# Patient Record
Sex: Male | Born: 1985 | Race: White | Hispanic: No | Marital: Single | Smoking: Never smoker
Health system: Southern US, Community
[De-identification: ages and names within clinical notes are randomized; demographics above are authoritative.]

## PROBLEM LIST (undated history)

## (undated) HISTORY — PX: CLEFT PALATE REPAIR: SUR1165

---
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
## 2014-11-13 ENCOUNTER — Encounter: Payer: Self-pay | Admitting: Emergency Medicine

## 2014-11-13 ENCOUNTER — Emergency Department: Payer: Self-pay

## 2014-11-13 ENCOUNTER — Emergency Department
Admission: EM | Admit: 2014-11-13 | Discharge: 2014-11-13 | Disposition: A | Discharge status: Home / Self Care | Payer: Self-pay | Attending: Emergency Medicine | Admitting: Emergency Medicine

## 2014-11-13 DIAGNOSIS — F419 Anxiety disorder, unspecified: Secondary | ICD-10-CM | POA: Insufficient documentation

## 2014-11-13 DIAGNOSIS — R002 Palpitations: Secondary | ICD-10-CM | POA: Insufficient documentation

## 2014-11-13 DIAGNOSIS — R0789 Other chest pain: Secondary | ICD-10-CM | POA: Insufficient documentation

## 2014-11-13 DIAGNOSIS — R079 Chest pain, unspecified: Secondary | ICD-10-CM

## 2014-11-13 DIAGNOSIS — R200 Anesthesia of skin: Secondary | ICD-10-CM | POA: Insufficient documentation

## 2014-11-13 LAB — COMPREHENSIVE METABOLIC PANEL
ALT: 15 U/L — ABNORMAL LOW (ref 17–63)
ANION GAP: 7 (ref 5–15)
AST: 24 U/L (ref 15–41)
Albumin: 5.2 g/dL — ABNORMAL HIGH (ref 3.5–5.0)
Alkaline Phosphatase: 88 U/L (ref 38–126)
BILIRUBIN TOTAL: 0.6 mg/dL (ref 0.3–1.2)
BUN: 11 mg/dL (ref 6–20)
CO2: 30 mmol/L (ref 22–32)
Calcium: 10 mg/dL (ref 8.9–10.3)
Chloride: 105 mmol/L (ref 101–111)
Creatinine, Ser: 1 mg/dL (ref 0.61–1.24)
GFR calc Af Amer: >60 mL/min (ref >60)
Glucose, Bld: 96 mg/dL (ref 65–99)
POTASSIUM: 3.8 mmol/L (ref 3.5–5.1)
Sodium: 142 mmol/L (ref 135–145)
TOTAL PROTEIN: 8.8 g/dL — AB (ref 6.5–8.1)

## 2014-11-13 LAB — URINE DRUG SCREEN, QUALITATIVE (ARMC ONLY)
Amphetamines, Ur Screen: NOT DETECTED
BARBITURATES, UR SCREEN: NOT DETECTED
Benzodiazepine, Ur Scrn: NOT DETECTED
COCAINE METABOLITE, UR ~~LOC~~: NOT DETECTED
Cannabinoid 50 Ng, Ur ~~LOC~~: NOT DETECTED
MDMA (ECSTASY) UR SCREEN: NOT DETECTED
METHADONE SCREEN, URINE: NOT DETECTED
OPIATE, UR SCREEN: NOT DETECTED
Phencyclidine (PCP) Ur S: NOT DETECTED
Tricyclic, Ur Screen: NOT DETECTED

## 2014-11-13 LAB — URINALYSIS COMPLETE WITH MICROSCOPIC (ARMC ONLY)
Bacteria, UA: NONE SEEN
Bilirubin Urine: NEGATIVE
GLUCOSE, UA: NEGATIVE mg/dL
HGB URINE DIPSTICK: NEGATIVE
Ketones, ur: NEGATIVE mg/dL
LEUKOCYTES UA: NEGATIVE
NITRITE: NEGATIVE
Protein, ur: NEGATIVE mg/dL
SPECIFIC GRAVITY, URINE: 1.001 — AB (ref 1.005–1.030)
SQUAMOUS EPITHELIAL / LPF: NONE SEEN
pH: 6 (ref 5.0–8.0)

## 2014-11-13 LAB — CBC
HEMATOCRIT: 42.3 % (ref 40.0–52.0)
Hemoglobin: 14.2 g/dL (ref 13.0–18.0)
MCH: 28.3 pg (ref 26.0–34.0)
MCHC: 33.7 g/dL (ref 32.0–36.0)
MCV: 84.1 fL (ref 80.0–100.0)
Platelets: 240 10*3/uL (ref 150–440)
RBC: 5.03 MIL/uL (ref 4.40–5.90)
RDW: 16 % — AB (ref 11.5–14.5)
WBC: 6.6 10*3/uL (ref 3.8–10.6)

## 2014-11-13 LAB — TROPONIN I

## 2014-11-13 MED ORDER — IBUPROFEN 600 MG PO TABS: 600.0000 mg | ORAL_TABLET | Freq: Three times a day (TID) | ORAL | Status: DC | PRN

## 2014-11-13 MED ORDER — KETOROLAC TROMETHAMINE 30 MG/ML IJ SOLN
30.0000 mg | Freq: Once | INTRAMUSCULAR | Status: AC
  Administered 2014-11-13: 30 mg via INTRAVENOUS
  Filled 2014-11-13: qty 1

## 2014-11-13 MED ORDER — PANTOPRAZOLE SODIUM 40 MG IV SOLR
40.0000 mg | Freq: Once | INTRAVENOUS | Status: AC
  Administered 2014-11-13: 40 mg via INTRAVENOUS
  Filled 2014-11-13: qty 40

## 2014-11-13 NOTE — ED Provider Notes (Signed)
[facility] Emergency Department Provider Note  ____________________________________________  Time seen: Approximately 5:03 AM  I have reviewed the triage vital signs and the nursing notes.   HISTORY  Chief Complaint Chest Pain and Numbness    HPI FRANKI STEMEN is a 29 y.o. male who presents to the ED from home with the chief complaint of chest pain. Patient reports a two-week history of intermittent left-sided sharp chest pain radiating to his left armpit and arm. Has not sought medical evaluation. Presents tonight due to his girlfriend and mother bring him to the ED for patient awakening with chest pain associated with palpitations. Patient thinks he woke up secondary to a bad dream. Appears anxious. Denies recent fever, chills, cough, shortness of breath, nausea, vomiting, diarrhea. Denies tearing or ripping type of chest pain. States he is ambidextrous but mostly uses his left arm for most things. Works at [employer]. Has been taking Pepto-Bismol more recently secondary to "gas". Denies recent travel or trauma   History reviewed. No pertinent past medical history.  There are no active problems to display for this patient.   Past Surgical History  Procedure Laterality Date  . Cleft palate repair      Current Outpatient Rx  Name  Route  Sig  Dispense  Refill  . bismuth subsalicylate (PEPTO BISMOL) 262 MG/15ML suspension   Oral   Take 30 mLs by mouth every 4 (four) hours as needed.         . ranitidine (ZANTAC) 150 MG tablet   Oral   Take 150 mg by mouth 2 (two) times daily as needed for heartburn.           Allergies Bee venom and Codeine  Family history "Heart problems"   Social History Social History  Substance Use Topics  . Smoking status: Never Smoker   . Smokeless tobacco: None  . Alcohol Use: No    Review of Systems Constitutional: No fever/chills Eyes: No visual changes. ENT: No sore throat. Cardiovascular: Positive  for chest pain. Respiratory: Denies shortness of breath. Gastrointestinal: No abdominal pain.  No nausea, no vomiting.  No diarrhea.  No constipation. Genitourinary: Negative for dysuria. Musculoskeletal: Negative for back pain. Skin: Negative for rash. Neurological: Negative for headaches, focal weakness or numbness.  10-point ROS otherwise negative.  ____________________________________________   PHYSICAL EXAM:  VITAL SIGNS: ED Triage Vitals  Enc Vitals Group     BP 11/13/14 0437 124/90 mmHg     Pulse Rate 11/13/14 0437 97     Resp 11/13/14 0437 18     Temp 11/13/14 0437 98 F (36.7 C)     Temp Source 11/13/14 0437 Oral     SpO2 11/13/14 0437 98 %     Weight 11/13/14 0437 145 lb (65.772 kg)     Height 11/13/14 0437  (1.854 m)     Head Cir --      Peak Flow --      Pain Score 11/13/14 0433 5     Pain Loc --      Pain Edu? --      Excl. in GC? --     Constitutional: Alert and oriented. Well appearing and in no acute distress. Anxious. Eyes: Conjunctivae are normal. PERRL. EOMI. Head: Atraumatic. Nose: No congestion/rhinnorhea. Mouth/Throat: Mucous membranes are moist.  Oropharynx non-erythematous. Neck: No stridor.   Cardiovascular: Normal rate, regular rhythm. Grossly normal heart sounds.  Good peripheral circulation. Respiratory: Normal respiratory effort.  No retractions. Lungs  CTAB. Anterior chest tender to palpation and also with movement of LUE. Gastrointestinal: Soft and nontender. No distention. No abdominal bruits. No CVA tenderness. Musculoskeletal: No lower extremity tenderness nor edema.  No joint effusions. Neurologic:  Normal speech and language. No gross focal neurologic deficits are appreciated. No gait instability. Skin:  Skin is warm, dry and intact. No rash noted. Psychiatric: Mood and affect are normal. Speech and behavior are normal.  ____________________________________________   LABS (all labs ordered are listed, but only abnormal  results are displayed)  Labs Reviewed  CBC - Abnormal; Notable for the following:    RDW 16.0 (*)    All other components within normal limits  COMPREHENSIVE METABOLIC PANEL - Abnormal; Notable for the following:    Total Protein 8.8 (*)    Albumin 5.2 (*)    ALT 15 (*)    All other components within normal limits  URINALYSIS COMPLETEWITH MICROSCOPIC ([facility] ONLY) - Abnormal; Notable for the following:    Color, Urine COLORLESS (*)    APPearance CLEAR (*)    Specific Gravity, Urine 1.001 (*)    All other components within normal limits  TROPONIN I  URINE DRUG SCREEN, QUALITATIVE ([facility] ONLY)   ____________________________________________  EKG  ED ECG REPORT I, Rickell Wiehe J, the attending physician, personally viewed and interpreted this ECG.   Date: 11/13/2014  EKG Time: 0434  Rate: 72  Rhythm: normal EKG, normal sinus rhythm  Axis: Normal  Intervals:none  ST&T Change: Nonspecific  ____________________________________________  RADIOLOGY  Chest 2 view (view by me, interpreted per Dr. Karie Kirks): Hyperinflation without acute cardiopulmonary process. ____________________________________________   PROCEDURES  Procedure(s) performed: None  Critical Care performed: No  ____________________________________________   INITIAL IMPRESSION / ASSESSMENT AND PLAN / ED COURSE  Pertinent labs & imaging results that were available during my care of the patient were reviewed by me and considered in my medical decision making (see chart for details).  29 year old male with a two-week history of intermittent chest pain which is reproducible on exam. Will check screening labs including troponin, obtain chest x-ray; administer IV NSAIDs and reassess.  ----------------------------------------- 6:27 AM on 11/13/2014 -----------------------------------------  Patient improved. Updated patient and family of laboratory results. Strict return precautions given. All verbalize  understanding and agree with plan of care. ____________________________________________   FINAL CLINICAL IMPRESSION(S) / ED DIAGNOSES  Final diagnoses:  Chest pain, unspecified chest pain type  Chest wall pain      Irean Hong, MD 11/13/14 [phone]

## 2014-11-13 NOTE — ED Notes (Signed)
Pt c/o intermittent left sided chest pain, sharp, for about 2 weeks; numbness down left arm to fingertips; "soreness" under left arm; left arm weakness; tonight the pain woke him from sleep; felt like his heart was racing;  last vomited yesterday;

## 2014-11-13 NOTE — ED Notes (Signed)
Pt presents to ED with c/o chest tightness x2 weeks, dyspnea (pt reports pain when taking deep breaths), and left sided weakness and numbness. Pt reports most of the weakness/numbness is in the LUE, but reports similar s/x's occasionally in the LLE. Pt is A&O, in NAD,with respirations even, regular, and unlabored. Family at bedside.

## 2014-11-13 NOTE — Discharge Instructions (Signed)
1. Take pain medicine as needed (Motrin #15). 2. Apply moist heat to affected area several times daily. 3. Return to the ER for worsening symptoms, persistent vomiting, difficulty breathing or other concerns.  Chest Wall Pain Chest wall pain is pain in or around the bones and muscles of your chest. Sometimes, an injury causes this pain. Sometimes, the cause may not be known. This pain may take several weeks or longer to get better. HOME CARE INSTRUCTIONS  Pay attention to any changes in your symptoms. Take these actions to help with your pain:   Rest as told by your health care provider.   Avoid activities that cause pain. These include any activities that use your chest muscles or your abdominal and side muscles to lift heavy items.   If directed, apply ice to the painful area:  Put ice in a plastic bag.  Place a towel between your skin and the bag.  Leave the ice on for 20 minutes, 2-3 times per day.  Take over-the-counter and prescription medicines only as told by your health care provider.  Do not use tobacco products, including cigarettes, chewing tobacco, and e-cigarettes. If you need help quitting, ask your health care provider.  Keep all follow-up visits as told by your health care provider. This is important. SEEK MEDICAL CARE IF:  You have a fever.  Your chest pain becomes worse.  You have new symptoms. SEEK IMMEDIATE MEDICAL CARE IF:  You have nausea or vomiting.  You feel sweaty or light-headed.  You have a cough with phlegm (sputum) or you cough up blood.  You develop shortness of breath.   This information is not intended to replace advice given to you by your health care provider. Make sure you discuss any questions you have with your health care provider.   Document Released: 01/21/2005 Document Revised: 10/12/2014 Document Reviewed: 04/18/2014 Elsevier Interactive Patient Education [employer]

## 2015-10-06 ENCOUNTER — Emergency Department
Admission: EM | Admit: 2015-10-06 | Discharge: 2015-10-06 | Disposition: A | Discharge status: Home / Self Care | Payer: Self-pay | Attending: Emergency Medicine | Admitting: Emergency Medicine

## 2015-10-06 DIAGNOSIS — M549 Dorsalgia, unspecified: Secondary | ICD-10-CM

## 2015-10-06 DIAGNOSIS — M542 Cervicalgia: Secondary | ICD-10-CM | POA: Insufficient documentation

## 2015-10-06 DIAGNOSIS — M5416 Radiculopathy, lumbar region: Secondary | ICD-10-CM | POA: Insufficient documentation

## 2015-10-06 DIAGNOSIS — H9211 Otorrhea, right ear: Secondary | ICD-10-CM | POA: Insufficient documentation

## 2015-10-06 LAB — URINALYSIS COMPLETE WITH MICROSCOPIC (ARMC ONLY)
BACTERIA UA: NONE SEEN
Bilirubin Urine: NEGATIVE
GLUCOSE, UA: NEGATIVE mg/dL
Hgb urine dipstick: NEGATIVE
Ketones, ur: NEGATIVE mg/dL
Leukocytes, UA: NEGATIVE
NITRITE: NEGATIVE
Protein, ur: NEGATIVE mg/dL
SPECIFIC GRAVITY, URINE: 1.023 (ref 1.005–1.030)
Squamous Epithelial / LPF: NONE SEEN
pH: 5 (ref 5.0–8.0)

## 2015-10-06 MED ORDER — TRAMADOL HCL 50 MG PO TABS: 50.0000 mg | ORAL_TABLET | Freq: Four times a day (QID) | ORAL | 0 refills | Status: AC | PRN

## 2015-10-06 MED ORDER — KETOROLAC TROMETHAMINE 30 MG/ML IJ SOLN
60.0000 mg | INTRAMUSCULAR | Status: DC
  Filled 2015-10-06: qty 2

## 2015-10-06 MED ORDER — OXYCODONE-ACETAMINOPHEN 5-325 MG PO TABS
2.0000 | ORAL_TABLET | Freq: Once | ORAL | Status: AC
  Administered 2015-10-06: 2 via ORAL
  Filled 2015-10-06: qty 2

## 2015-10-06 MED ORDER — IBUPROFEN 800 MG PO TABS: 800.0000 mg | ORAL_TABLET | Freq: Three times a day (TID) | ORAL | 0 refills | Status: DC | PRN

## 2015-10-06 MED ORDER — KETOROLAC TROMETHAMINE 60 MG/2ML IM SOLN
60.0000 mg | Freq: Once | INTRAMUSCULAR | Status: AC
  Administered 2015-10-06: 60 mg via INTRAMUSCULAR

## 2015-10-06 NOTE — ED Notes (Signed)
MD Taylor at bedside.

## 2015-10-06 NOTE — ED Notes (Signed)
Pt c/o lower back pain X 1 month. Pain radiates down patient's right leg.  Pt c/o headache beginning this morning that woke him from sleep. 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

## 2015-10-06 NOTE — ED Provider Notes (Addendum)
[facility] Emergency Department Provider Note  ___________________________________________   None    (approximate)  I have reviewed the triage vital signs and the nursing notes.   HISTORY  Chief Complaint Back Pain    HPI Kenneth Fowler is a 30 y.o. male who is complaining that he woke up with worsening back pain to his lower back that radiates into his right buttock and right leg. Patient also states that he has some pain on the left side of his neck. Patient denies any recent illnesses, dysuria, frequency, or hematuria. He denies any history of kidney stones. Patient states that the back pain is been going on for about 4-6 weeks but since it hurt more this morning he decided to get checked out.  No past medical history on file.  There are no active problems to display for this patient.   Past Surgical History:  Procedure Laterality Date  . CLEFT PALATE REPAIR      Prior to Admission medications   Medication Sig Start Date End Date Taking? Authorizing Provider  bismuth subsalicylate (PEPTO BISMOL) 262 MG/15ML suspension Take 30 mLs by mouth every 4 (four) hours as needed.    Historical Provider, MD  ibuprofen (ADVIL,MOTRIN) 600 MG tablet Take 1 tablet (600 mg total) by mouth every 8 (eight) hours as needed. 11/13/14   Irean Hong, MD  ranitidine (ZANTAC) 150 MG tablet Take 150 mg by mouth 2 (two) times daily as needed for heartburn.    Historical Provider, MD    Allergies Bee venom and Codeine  No family history on file.  Social History Social History  Substance Use Topics  . Smoking status: Never Smoker  . Smokeless tobacco: Not on file  . Alcohol use No    Review of Systems Constitutional: No fever/chills Eyes: No visual changes. ENT: No sore throat. Cardiovascular: Denies chest pain. Respiratory: Denies shortness of breath. Gastrointestinal: No abdominal pain.  No nausea, no vomiting.  No diarrhea.  No constipation. Genitourinary:  Negative for dysuria.No hematuria or frequency Musculoskeletal: Positive for back pain lower than the radiates into his right buttock and right leg. Positive for neck pain on the left side of his neck. Skin: Negative for rash. Neurological: Negative for headaches, focal weakness or numbness.  10-point ROS otherwise negative.  ____________________________________________   PHYSICAL EXAM:  VITAL SIGNS: ED Triage Vitals  Enc Vitals Group     BP 10/06/15 0531 130/75     Pulse Rate 10/06/15 0531 78     Resp 10/06/15 0531 16     Temp 10/06/15 0531 98.1 F (36.7 C)     Temp Source 10/06/15 0531 Oral     SpO2 10/06/15 0531 100 %     Weight 10/06/15 0530 140 lb (63.5 kg)     Height 10/06/15 0530 6' (1.829 m)     Head Circumference --      Peak Flow --      Pain Score 10/06/15 0530 8     Pain Loc --      Pain Edu? --      Excl. in GC? --     Constitutional: Alert and oriented. Well appearing and in no acute distress. Eyes: Conjunctivae are normal. PERRL. EOMI. Head: Atraumatic.History cleft palate repair. Right TM has a small amount of whitish drainage. Nose: No congestion/rhinnorhea. Mouth/Throat: Mucous membranes are moist.  Oropharynx non-erythematous. Neck: No stridor.   Cardiovascular: Normal rate, regular rhythm. Grossly normal heart sounds.  Good peripheral circulation. Respiratory:  Normal respiratory effort.  No retractions. Lungs CTAB. Gastrointestinal: Soft and nontender. No distention. No abdominal bruits. No CVA tenderness. Musculoskeletal: No lower extremity tenderness nor edema.  No joint effusions.Patient with mild tenderness to his right paralumbar muscle area into his right mid buttock with a mild straight leg raise on the right. Patient also has some mild tenderness along the left side of his neck. Patient does not appear to be significantly uncomfortable. Patient has no CVA tenderness. Neurologic:  Normal speech and language. No gross focal neurologic deficits are  appreciated. No gait instability. Skin:  Skin is warm, dry and intact. No rash noted. Psychiatric: Mood and affect are normal. Speech and behavior are normal.  ____________________________________________   LABS (all labs ordered are listed, but only abnormal results are displayed)  Labs Reviewed  URINALYSIS COMPLETEWITH MICROSCOPIC ([facility] ONLY) - Abnormal; Notable for the following:       Result Value   Color, Urine YELLOW (*)    APPearance CLEAR (*)    All other components within normal limits   ____________________________________________  EKG  none ____________________________________________  RADIOLOGY none ____________________________________________   PROCEDURES  Procedure(s) performed: None  Procedures  Critical Care performed: No  ____________________________________________   INITIAL IMPRESSION / ASSESSMENT AND PLAN / ED COURSE  Pertinent labs & imaging results that were available during my care of the patient were reviewed by me and considered in my medical decision making (see chart for details).  7:13 AM He was given a shot of Toradol while awaiting his urinalysis. The urinalysis did not show anything acute. Patient was told that we will follow him up with orthopedics to be evaluated further. Patient was told to return immediately if condition worsens. Patient was placed on tramadol and ibuprofen to take at home.  Clinical Course     ____________________________________________   FINAL CLINICAL IMPRESSION(S) / ED DIAGNOSES  Final diagnoses:  Acute back pain  Lumbar radiculopathy      NEW MEDICATIONS STARTED DURING THIS VISIT:  New Prescriptions   No medications on file     Note:  This document was prepared using Dragon voice recognition software and may include unintentional dictation errors.    Leona CarryLinda M Taler Kushner, MD 10/06/15 [id number]    Leona CarryLinda M Nataliyah Packham, MD 10/06/15 [phone]

## 2015-10-06 NOTE — ED Triage Notes (Signed)
Patient ambulatory to triage with steady gait, without difficulty or distress noted; pt reports lower back pain radiating down right leg x month; st was awoken PTA with pain; denies any known injury

## 2017-09-30 ENCOUNTER — Other Ambulatory Visit
Admission: RE | Admit: 2017-09-30 | Discharge: 2017-09-30 | Disposition: A | Discharge status: Home / Self Care | Payer: 59 | Source: Ambulatory Visit | Attending: Family | Admitting: Family

## 2017-09-30 DIAGNOSIS — W6191XA Bitten by other birds, initial encounter: Secondary | ICD-10-CM | POA: Insufficient documentation

## 2017-09-30 DIAGNOSIS — R6883 Chills (without fever): Secondary | ICD-10-CM | POA: Insufficient documentation

## 2017-09-30 LAB — CBC WITH DIFFERENTIAL/PLATELET
BASOS ABS: 0 10*3/uL (ref 0–0.1)
Basophils Relative: 0 %
Eosinophils Absolute: 0.2 10*3/uL (ref 0–0.7)
Eosinophils Relative: 3 %
HEMATOCRIT: 41.2 % (ref 40.0–52.0)
Hemoglobin: 14.1 g/dL (ref 13.0–18.0)
LYMPHS ABS: 1.3 10*3/uL (ref 1.0–3.6)
LYMPHS PCT: 22 %
MCH: 29.3 pg (ref 26.0–34.0)
MCHC: 34.2 g/dL (ref 32.0–36.0)
MCV: 85.5 fL (ref 80.0–100.0)
MONO ABS: 0.8 10*3/uL (ref 0.2–1.0)
Monocytes Relative: 13 %
NEUTROS ABS: 3.7 10*3/uL (ref 1.4–6.5)
Neutrophils Relative %: 62 %
Platelets: 232 10*3/uL (ref 150–440)
RBC: 4.82 MIL/uL (ref 4.40–5.90)
RDW: 16.4 % — AB (ref 11.5–14.5)
WBC: 6 10*3/uL (ref 3.8–10.6)

## 2017-10-01 LAB — MISC LABCORP TEST (SEND OUT)
Labcorp test code: 138006
Labcorp test code: 138022

## 2020-04-22 ENCOUNTER — Emergency Department: Payer: Managed Care, Other (non HMO)

## 2020-04-22 ENCOUNTER — Other Ambulatory Visit: Payer: Self-pay

## 2020-04-22 ENCOUNTER — Emergency Department
Admission: EM | Admit: 2020-04-22 | Discharge: 2020-04-22 | Disposition: A | Discharge status: Home / Self Care | Payer: Managed Care, Other (non HMO) | Attending: Emergency Medicine | Admitting: Emergency Medicine

## 2020-04-22 DIAGNOSIS — R1031 Right lower quadrant pain: Secondary | ICD-10-CM | POA: Diagnosis present

## 2020-04-22 DIAGNOSIS — N201 Calculus of ureter: Secondary | ICD-10-CM | POA: Insufficient documentation

## 2020-04-22 DIAGNOSIS — R3129 Other microscopic hematuria: Secondary | ICD-10-CM

## 2020-04-22 DIAGNOSIS — N3001 Acute cystitis with hematuria: Secondary | ICD-10-CM | POA: Insufficient documentation

## 2020-04-22 LAB — URINALYSIS, COMPLETE (UACMP) WITH MICROSCOPIC
RBC / HPF: >50 RBC/hpf — ABNORMAL HIGH (ref 0–5)
Specific Gravity, Urine: 1.024 (ref 1.005–1.030)
Squamous Epithelial / LPF: NONE SEEN (ref 0–5)

## 2020-04-22 LAB — BASIC METABOLIC PANEL
Anion gap: 7 (ref 5–15)
BUN: 16 mg/dL (ref 6–20)
CO2: 25 mmol/L (ref 22–32)
Calcium: 9.6 mg/dL (ref 8.9–10.3)
Chloride: 106 mmol/L (ref 98–111)
Creatinine, Ser: 0.81 mg/dL (ref 0.61–1.24)
GFR, Estimated: >60 mL/min (ref >60)
Glucose, Bld: 111 mg/dL — ABNORMAL HIGH (ref 70–99)
Potassium: 4.2 mmol/L (ref 3.5–5.1)
Sodium: 138 mmol/L (ref 135–145)

## 2020-04-22 LAB — CBC
HCT: 40.1 % (ref 39.0–52.0)
Hemoglobin: 13.6 g/dL (ref 13.0–17.0)
MCH: 29.2 pg (ref 26.0–34.0)
MCHC: 33.9 g/dL (ref 30.0–36.0)
MCV: 86.1 fL (ref 80.0–100.0)
Platelets: 221 10*3/uL (ref 150–400)
RBC: 4.66 MIL/uL (ref 4.22–5.81)
RDW: 14.5 % (ref 11.5–15.5)
WBC: 4.2 10*3/uL (ref 4.0–10.5)
nRBC: 0 % (ref 0.0–0.2)

## 2020-04-22 MED ORDER — ONDANSETRON HCL 4 MG/2ML IJ SOLN
4.0000 mg | Freq: Once | INTRAMUSCULAR | Status: AC
  Administered 2020-04-22: 4 mg via INTRAVENOUS
  Filled 2020-04-22: qty 2

## 2020-04-22 MED ORDER — HYDROMORPHONE HCL 1 MG/ML IJ SOLN
1.0000 mg | Freq: Once | INTRAMUSCULAR | Status: AC
  Administered 2020-04-22: 1 mg via INTRAVENOUS
  Filled 2020-04-22: qty 1

## 2020-04-22 MED ORDER — ONDANSETRON HCL 8 MG PO TABS: 8.0000 mg | ORAL_TABLET | Freq: Three times a day (TID) | ORAL | 0 refills | Status: DC | PRN

## 2020-04-22 MED ORDER — IOHEXOL 300 MG/ML  SOLN
100.0000 mL | Freq: Once | INTRAMUSCULAR | Status: AC | PRN
  Administered 2020-04-22: 100 mL via INTRAVENOUS
  Filled 2020-04-22: qty 100

## 2020-04-22 MED ORDER — KETOROLAC TROMETHAMINE 30 MG/ML IJ SOLN
30.0000 mg | Freq: Once | INTRAMUSCULAR | Status: AC
  Administered 2020-04-22: 30 mg via INTRAVENOUS
  Filled 2020-04-22: qty 1

## 2020-04-22 MED ORDER — OXYCODONE-ACETAMINOPHEN 5-325 MG PO TABS: 1.0000 | ORAL_TABLET | Freq: Four times a day (QID) | ORAL | 0 refills | Status: DC | PRN

## 2020-04-22 MED ORDER — IOHEXOL 9 MG/ML PO SOLN
1000.0000 mL | Freq: Once | ORAL | Status: AC
  Administered 2020-04-22: 1000 mL via ORAL
  Filled 2020-04-22: qty 1000

## 2020-04-22 MED ORDER — SULFAMETHOXAZOLE-TRIMETHOPRIM 800-160 MG PO TABS: 1.0000 | ORAL_TABLET | Freq: Two times a day (BID) | ORAL | 0 refills | Status: DC

## 2020-04-22 NOTE — Discharge Instructions (Addendum)
Follow discharge care instructions increase fluid intake.  Follow-up with urology if no improvement in 2 days.  Return to ED if condition worsens.

## 2020-04-22 NOTE — ED Triage Notes (Signed)
Pt with RLQ pain for over a month, states right testicle pain as well. Pt states he had worsening pain this am in groin and RLQ and noticed blood in urine. Pt has family hx of kidney stones.

## 2020-04-22 NOTE — ED Notes (Signed)
Patient sat up to use the urinal and vomited a large amount of clear liquid. Provider aware.

## 2020-04-22 NOTE — ED Provider Notes (Signed)
[facility] Emergency Department Provider Note   ____________________________________________   Event Date/Time   First MD Initiated Contact with Patient 04/22/20 1127     (approximate)  I have reviewed the triage vital signs and the nursing notes.   HISTORY  Chief Complaint Flank Pain    HPI Kenneth Fowler is a 35 y.o. male patient complain of right lower quadrant pain for over a month.  Patient pain is now descending to right scrotum area.  Patient states pain worsened this morning.  Patient noticed blood in his urine this morning.  Patient stated there is a family history of kidney stones but he had never experienced them before.  Patient denies any provocative incident for this complaint.  Patient denies fever.  Patient did have some nausea which he believes is secondary to him observing blood in his urine.  No vomiting.  No diarrhea.  Rates pain as of 10/10.         History reviewed. No pertinent past medical history.  There are no problems to display for this patient.   Past Surgical History:  Procedure Laterality Date  . CLEFT PALATE REPAIR      Prior to Admission medications   Medication Sig Start Date End Date Taking? Authorizing Provider  ondansetron (ZOFRAN) 8 MG tablet Take 1 tablet (8 mg total) by mouth every 8 (eight) hours as needed for nausea or vomiting. 04/22/20  Yes Joni Reining, PA-C  oxyCODONE-acetaminophen (PERCOCET) 5-325 MG tablet Take 1 tablet by mouth every 6 (six) hours as needed for severe pain. 04/22/20 04/22/21 Yes Joni Reining, PA-C  sulfamethoxazole-trimethoprim (BACTRIM DS) 800-160 MG tablet Take 1 tablet by mouth 2 (two) times daily. 04/22/20  Yes Joni Reining, PA-C  bismuth subsalicylate (PEPTO BISMOL) 262 MG/15ML suspension Take 30 mLs by mouth every 4 (four) hours as needed.    [provider]  ibuprofen (ADVIL,MOTRIN) 800 MG tablet Take 1 tablet (800 mg total) by mouth every 8 (eight) hours as  needed. 10/06/15   Leona Carry, MD  ranitidine (ZANTAC) 150 MG tablet Take 150 mg by mouth 2 (two) times daily as needed for heartburn.    [provider]    Allergies Bee venom and Codeine  History reviewed. No pertinent family history.  Social History Social History   Tobacco Use  . Smoking status: Never Smoker  . Smokeless tobacco: Never Used  Substance Use Topics  . Alcohol use: No  . Drug use: No    Review of Systems Constitutional: No fever/chills Eyes: No visual changes. ENT: No sore throat. Cardiovascular: Denies chest pain. Respiratory: Denies shortness of breath. Gastrointestinal: Right lower abdominal pain.  No nausea, no vomiting.  No diarrhea.  No constipation. Genitourinary: Negative for dysuria.  States hematuria and right scrotal pain. Musculoskeletal: Negative for back pain. Skin: Negative for rash. Neurological: Negative for headaches, focal weakness or numbness.  Allergic/Immunilogical: Bee venom and codeine. ____________________________________________   PHYSICAL EXAM:  VITAL SIGNS: ED Triage Vitals  Enc Vitals Group     BP 04/22/20 1109 105/82     Pulse Rate 04/22/20 1109 81     Resp 04/22/20 1109 20     Temp 04/22/20 1109 98.1 F (36.7 C)     Temp Source 04/22/20 1109 Oral     SpO2 04/22/20 1109 100 %     Weight 04/22/20 1112 165 lb (74.8 kg)     Height 04/22/20 1112 6' (1.829 m)     Head  Circumference --      Peak Flow --      Pain Score 04/22/20 1111 10     Pain Loc --      Pain Edu? --      Excl. in GC? --     Constitutional: Alert and oriented. Well appearing and in no acute distress. Cardiovascular: Normal rate, regular rhythm. Grossly normal heart sounds.  Good peripheral circulation. Respiratory: Normal respiratory effort.  No retractions. Lungs CTAB. Gastrointestinal: No abdominal distention.  Normoactive bowel sounds.  Soft with mild guarding right lower quadrant.. . No abdominal bruits. No CVA  tenderness. Genitourinary: No scrotal edema or erythema. Musculoskeletal: No lower extremity tenderness nor edema.  No joint effusions. Neurologic:  Normal speech and language. No gross focal neurologic deficits are appreciated. No gait instability. Skin:  Skin is warm, dry and intact. No rash noted. Psychiatric: Mood and affect are normal. Speech and behavior are normal.  ____________________________________________   LABS (all labs ordered are listed, but only abnormal results are displayed)  Labs Reviewed  URINALYSIS, COMPLETE (UACMP) WITH MICROSCOPIC - Abnormal; Notable for the following components:      Result Value   Color, Urine RED (*)    APPearance CLOUDY (*)    Glucose, UA   (*)    Value: TEST NOT REPORTED DUE TO COLOR INTERFERENCE OF URINE PIGMENT   Hgb urine dipstick   (*)    Value: TEST NOT REPORTED DUE TO COLOR INTERFERENCE OF URINE PIGMENT   Bilirubin Urine   (*)    Value: TEST NOT REPORTED DUE TO COLOR INTERFERENCE OF URINE PIGMENT   Ketones, ur   (*)    Value: TEST NOT REPORTED DUE TO COLOR INTERFERENCE OF URINE PIGMENT   Protein, ur   (*)    Value: TEST NOT REPORTED DUE TO COLOR INTERFERENCE OF URINE PIGMENT   Nitrite   (*)    Value: TEST NOT REPORTED DUE TO COLOR INTERFERENCE OF URINE PIGMENT   Leukocytes,Ua   (*)    Value: TEST NOT REPORTED DUE TO COLOR INTERFERENCE OF URINE PIGMENT   RBC / HPF >50 (*)    Bacteria, UA MANY (*)    All other components within normal limits  BASIC METABOLIC PANEL - Abnormal; Notable for the following components:   Glucose, Bld 111 (*)    All other components within normal limits  URINE CULTURE  CBC   ____________________________________________  EKG   ____________________________________________  RADIOLOGY I, Joni Reining, personally viewed and evaluated these images (plain radiographs) as part of my medical decision making, as well as reviewing the written report by the radiologist.  ED MD interpretation:     Official radiology report(s): CT ABDOMEN PELVIS W CONTRAST  Result Date: 04/22/2020 CLINICAL DATA:  Right lower quadrant pain for 1 month. Assess for abscess. EXAM: CT ABDOMEN AND PELVIS WITH CONTRAST TECHNIQUE: Multidetector CT imaging of the abdomen and pelvis was performed using the standard protocol following bolus administration of intravenous contrast. CONTRAST:  OMNIPAQUE IOHEXOL 300 MG/ML  SOLN COMPARISON:  None. FINDINGS: Lower chest: No acute abnormality. Hepatobiliary: No focal liver abnormality is seen. No gallstones, gallbladder wall thickening, or biliary dilatation. Pancreas: Unremarkable. No pancreatic ductal dilatation or surrounding inflammatory changes. Spleen: Normal in size without focal abnormality. Adrenals/Urinary Tract: Adrenal glands are unremarkable. Small nonobstructing stones are identified in bilateral kidneys. There is no hydronephrosis bilaterally. No focal kidney lesion is noted. Bladder is unremarkable. Stomach/Bowel: Stomach is within normal limits. The appendix is not  seen but no inflammation is noted around cecum. No evidence of bowel wall thickening, distention, or inflammatory changes. Vascular/Lymphatic: No significant vascular findings are present. No enlarged abdominal or pelvic lymph nodes. Reproductive: Prostate is unremarkable. Other: No abdominal wall hernia or abnormality. No abdominopelvic ascites. No abscess is noted. Musculoskeletal: No acute or significant osseous findings. IMPRESSION: 1. No acute abnormality identified in the abdomen and pelvis. The appendix is not seen but no inflammation is noted around cecum. No abscess is identified. 2. Small nonobstructing stones in bilateral kidneys. Electronically Signed   By: Sherian Rein M.D.   On: 04/22/2020 16:23    ____________________________________________   PROCEDURES  Procedure(s) performed (including Critical Care):  Procedures   ____________________________________________   INITIAL  IMPRESSION / ASSESSMENT AND PLAN / ED COURSE  As part of my medical decision making, I reviewed the following data within the electronic MEDICAL RECORD NUMBER         Patient presents for right lower quadrant pain for over a month.  Patient states his radiation to the right scrotum which occurred today.  Patient also noticed hematuria today.  Discussed patient urine which was positive for greater than 50 RBCs and 21-50 WBCs and many bacteria.  Discussed no acute findings on CT of the abdomen and pelvis.  Incidental finding of a small bilateral kidney stones.  Discussed with patient that the hematuria is probably passing out previous kidney stones.  Advised to follow discharge care instruction take medication as directed.  If hematuria does not resolve in 2 days to follow urology listed in discharge care instructions.  Return to ED if condition worsens.      ____________________________________________   FINAL CLINICAL IMPRESSION(S) / ED DIAGNOSES  Final diagnoses:  Other microscopic hematuria  Acute cystitis with hematuria  Ureterolithiasis     ED Discharge Orders         Ordered    sulfamethoxazole-trimethoprim (BACTRIM DS) 800-160 MG tablet  2 times daily        04/22/20 1646    oxyCODONE-acetaminophen (PERCOCET) 5-325 MG tablet  Every 6 hours PRN        04/22/20 1646    ondansetron (ZOFRAN) 8 MG tablet  Every 8 hours PRN        04/22/20 1646          *Please note:  Kenneth Fowler was evaluated in Emergency Department on 04/22/2020 for the symptoms described in the history of present illness. He was evaluated in the context of the global COVID-19 pandemic, which necessitated consideration that the patient might be at risk for infection with the SARS-CoV-2 virus that causes COVID-19. Institutional protocols and algorithms that pertain to the evaluation of patients at risk for COVID-19 are in a state of rapid change based on information released by regulatory bodies including the CDC  and federal and state organizations. These policies and algorithms were followed during the patient's care in the ED.  Some ED evaluations and interventions may be delayed as a result of limited staffing during and the pandemic.*   Note:  This document was prepared using Dragon voice recognition software and may include unintentional dictation errors.    Joni Reining, PA-C 04/22/20 1707    Sharman Cheek, MD 04/28/20 [phone]

## 2020-04-23 LAB — URINE CULTURE: Culture: NO GROWTH

## 2020-04-27 ENCOUNTER — Other Ambulatory Visit: Payer: Self-pay

## 2020-04-27 ENCOUNTER — Ambulatory Visit: Payer: Managed Care, Other (non HMO) | Admitting: Urology

## 2020-04-27 ENCOUNTER — Encounter: Payer: Self-pay | Admitting: Urology

## 2020-04-27 VITALS — BP 106/70 | HR 93 | Ht 72.0 in | Wt 162.0 lb

## 2020-04-27 DIAGNOSIS — N200 Calculus of kidney: Secondary | ICD-10-CM | POA: Diagnosis not present

## 2020-04-27 MED ORDER — TAMSULOSIN HCL 0.4 MG PO CAPS: 0.4000 mg | ORAL_CAPSULE | Freq: Every day | ORAL | 0 refills | Status: DC

## 2020-04-27 NOTE — Progress Notes (Signed)
04/27/2020 4:07 PM   Kenneth Fowler 03-24-1985 [id number]  Referring provider: No referring provider defined for this encounter.  Chief Complaint  Patient presents with  . Nephrolithiasis    HPI: 35 year old male who presents today for further evaluation of hematuria and flank pain.  He was seen and evaluated in the emergency room on March 9 with acute onset severe right lower quadrant pain radiating to his right testicle.  He also had an episode of gross hematuria which is very disturbing to him.  He underwent a CT abdomen pelvis with contrast revealing bilateral punctate stones but no ureteral stones or hydronephrosis.  He did have a large amount of macroscopic blood in his urine.  He was prescribed Bactrim which he continues today for presumed infection although urine culture was negative.  Since discharge from the ER, he continues to have some mild dull right lower quadrant pain but this not nearly as severe.  No further nausea or vomiting.  No fevers or chills.  He does have some mild pain in his penis but this is also improving.  Urinalysis today shows 3-10 red blood cells per high-powered field otherwise unremarkable.  No personal history of kidney stones.  He does have a strong family history of kidney stones.  He previously drank a large amount of soda including cooking Dr. Reino Kent.   PMH: No past medical history on file.  Surgical History: Past Surgical History:  Procedure Laterality Date  . CLEFT PALATE REPAIR      Home Medications:  Allergies as of 04/27/2020      Reactions   Bee Venom Swelling   Codeine Other (See Comments)   Pt states he has a hx of addiction in family.      Medication List       Accurate as of April 27, 2020  4:07 PM. If you have any questions, ask your nurse or doctor.        STOP taking these medications   bismuth subsalicylate 262 MG/15ML suspension Commonly known as: PEPTO BISMOL Stopped by: Vanna Scotland, MD   ibuprofen  800 MG tablet Commonly known as: ADVIL Stopped by: Vanna Scotland, MD   ondansetron 8 MG tablet Commonly known as: Zofran Stopped by: Vanna Scotland, MD   ranitidine 150 MG tablet Commonly known as: ZANTAC Stopped by: Vanna Scotland, MD     TAKE these medications   oxyCODONE-acetaminophen 5-325 MG tablet Commonly known as: Percocet Take 1 tablet by mouth every 6 (six) hours as needed for severe pain.   sulfamethoxazole-trimethoprim 800-160 MG tablet Commonly known as: BACTRIM DS Take 1 tablet by mouth 2 (two) times daily.   tamsulosin 0.4 MG Caps capsule Commonly known as: FLOMAX Take 1 capsule (0.4 mg total) by mouth daily. As needed for stone pain Started by: Vanna Scotland, MD       Allergies:  Allergies  Allergen Reactions  . Bee Venom Swelling  . Codeine Other (See Comments)    Pt states he has a hx of addiction in family.    Family History: No family history on file.  Social History:  reports that he has never smoked. He has never used smokeless tobacco. He reports that he does not drink alcohol and does not use drugs.   Physical Exam: BP 106/70   Pulse 93   Ht 6' (1.829 m)   Wt 162 lb (73.5 kg)   BMI 21.97 kg/m   Constitutional:  Alert and oriented, No acute distress.  Accompanied today by  his wife. HEENT:  AT, moist mucus membranes.  Trachea midline, no masses. Cardiovascular: No clubbing, cyanosis, or edema. Respiratory: Normal respiratory effort, no increased work of breathing. Skin: No rashes, bruises or suspicious lesions. Neurologic: Grossly intact, no focal deficits, moving all 4 extremities. Psychiatric: Normal mood and affect.  Laboratory Data: Lab Results  Component Value Date   WBC 4.2 04/22/2020   HGB 13.6 04/22/2020   HCT 40.1 04/22/2020   MCV 86.1 04/22/2020   PLT 221 04/22/2020    Lab Results  Component Value Date   CREATININE 0.81 04/22/2020    Urinalysis    Component Value Date/Time   COLORURINE RED (A) 04/22/2020  1116   APPEARANCEUR CLOUDY (A) 04/22/2020 1116   LABSPEC 1.024 04/22/2020 1116   PHURINE  04/22/2020 1116    TEST NOT REPORTED DUE TO COLOR INTERFERENCE OF URINE PIGMENT   GLUCOSEU (A) 04/22/2020 1116    TEST NOT REPORTED DUE TO COLOR INTERFERENCE OF URINE PIGMENT   HGBUR (A) 04/22/2020 1116    TEST NOT REPORTED DUE TO COLOR INTERFERENCE OF URINE PIGMENT   BILIRUBINUR (A) 04/22/2020 1116    TEST NOT REPORTED DUE TO COLOR INTERFERENCE OF URINE PIGMENT   KETONESUR (A) 04/22/2020 1116    TEST NOT REPORTED DUE TO COLOR INTERFERENCE OF URINE PIGMENT   PROTEINUR (A) 04/22/2020 1116    TEST NOT REPORTED DUE TO COLOR INTERFERENCE OF URINE PIGMENT   NITRITE (A) 04/22/2020 1116    TEST NOT REPORTED DUE TO COLOR INTERFERENCE OF URINE PIGMENT   LEUKOCYTESUR (A) 04/22/2020 1116    TEST NOT REPORTED DUE TO COLOR INTERFERENCE OF URINE PIGMENT    Lab Results  Component Value Date   BACTERIA MANY (A) 04/22/2020    Pertinent Imaging: CT abdomen pelvis with contrast on April 22, 2020 was personally reviewed today.  Agree with radiologic interpretation.  There are punctate stones bilaterally but no obvious obstructing stones or hydronephrosis.  There is no perinephric stranding or any other intra-abdominal or pelvic pathology.  Assessment & Plan:    1. Kidney stones/right lower quadrant pain/microscopic and gross hematuria Most recent episode of right flank pain associated with gross hematuria likely related to interval passage of a stone although not appreciated on CT scan  He continues have some dull right lower quadrant pain but this is significantly improved, question whether this is related to ureteral spasm and/or inflammation.  Gross hematuria has resolved with residual minimal microscopic hematuria which goes along with this.  Advised that if his pain does not completely resolve by next week with utilization of ibuprofen and supportive care, let us know and we can repeat a noncontrast CT scan  to make sure we did not miss any ureteral calculi.  We discussed general stone prevention techniques including drinking plenty water with goal of producing 2.5 L urine daily, increased citric acid intake, avoidance of high oxalate containing foods, and decreased salt intake.  Information about dietary recommendations given today.  I also prescribed him a course of Flomax to use as needed in with acute stone episodes.  Given his fairly young age and bilateral punctate stones, recommend follow-up in 1 year with KUB to monitor the stones.  He is agreeable this plan. - Urinalysis, Complete    Vanna Scotland, MD  [facility] [address] [place], [place] [address] [phone]

## 2020-04-28 LAB — MICROSCOPIC EXAMINATION
Bacteria, UA: NONE SEEN
Epithelial Cells (non renal): NONE SEEN /hpf (ref 0–10)

## 2020-04-28 LAB — URINALYSIS, COMPLETE
Bilirubin, UA: NEGATIVE
Glucose, UA: NEGATIVE
Ketones, UA: NEGATIVE
Leukocytes,UA: NEGATIVE
Nitrite, UA: NEGATIVE
Protein,UA: NEGATIVE
Specific Gravity, UA: 1.025 (ref 1.005–1.030)
Urobilinogen, Ur: 0.2 mg/dL (ref 0.2–1.0)
pH, UA: 6.5 (ref 5.0–7.5)

## 2020-05-03 NOTE — Progress Notes (Deleted)
05/04/2020 8:50 AM   Kenneth Fowler Dec 29, 1985 [id number]  Referring provider: No referring provider defined for this encounter.  No chief complaint on file.  Urological history: 1. High risk hematuria -non-smoker -?secondary to passage of stone -contrast CT 04/2020 bilateral punctate stones -UA ***  2. Nephrolithiasis -punctate stones on 04/2020 CT -family history of stones    HPI: Kenneth Fowler is a 35 y.o. male who presents today with the complaint of pain in the groin.     PMH: No past medical history on file.  Surgical History: Past Surgical History:  Procedure Laterality Date  . CLEFT PALATE REPAIR      Home Medications:  Allergies as of 05/04/2020      Reactions   Bee Venom Swelling   Codeine Other (See Comments)   Pt states he has a hx of addiction in family.      Medication List       Accurate as of May 03, 2020  8:50 AM. If you have any questions, ask your nurse or doctor.        oxyCODONE-acetaminophen 5-325 MG tablet Commonly known as: Percocet Take 1 tablet by mouth every 6 (six) hours as needed for severe pain.   sulfamethoxazole-trimethoprim 800-160 MG tablet Commonly known as: BACTRIM DS Take 1 tablet by mouth 2 (two) times daily.   tamsulosin 0.4 MG Caps capsule Commonly known as: FLOMAX Take 1 capsule (0.4 mg total) by mouth daily. As needed for stone pain       Allergies:  Allergies  Allergen Reactions  . Bee Venom Swelling  . Codeine Other (See Comments)    Pt states he has a hx of addiction in family.    Family History: No family history on file.  Social History:  reports that he has never smoked. He has never used smokeless tobacco. He reports that he does not drink alcohol and does not use drugs.  ROS: Pertinent ROS in HPI  Physical Exam: There were no vitals taken for this visit.  Constitutional:  Well nourished. Alert and oriented, No acute distress. HEENT: [place] AT, moist mucus membranes.  Trachea  midline, no masses. Cardiovascular: No clubbing, cyanosis, or edema. Respiratory: Normal respiratory effort, no increased work of breathing. GI: Abdomen is soft, non tender, non distended, no abdominal masses. Liver and spleen not palpable.  No hernias appreciated.  Stool sample for occult testing is not indicated.   GU: No CVA tenderness.  No bladder fullness or masses.  Patient with circumcised/uncircumcised phallus. ***Foreskin easily retracted***  Urethral meatus is patent.  No penile discharge. No penile lesions or rashes. Scrotum without lesions, cysts, rashes and/or edema.  Testicles are located scrotally bilaterally. No masses are appreciated in the testicles. Left and right epididymis are normal. Rectal: Patient with  normal sphincter tone. Anus and perineum without scarring or rashes. No rectal masses are appreciated. Prostate is approximately *** grams, *** nodules are appreciated. Seminal vesicles are normal. Skin: No rashes, bruises or suspicious lesions. Lymph: No cervical or inguinal adenopathy. Neurologic: Grossly intact, no focal deficits, moving all 4 extremities. Psychiatric: Normal mood and affect.  Laboratory Data: Lab Results  Component Value Date   WBC 4.2 04/22/2020   HGB 13.6 04/22/2020   HCT 40.1 04/22/2020   MCV 86.1 04/22/2020   PLT 221 04/22/2020    Lab Results  Component Value Date   CREATININE 0.81 04/22/2020   Urinalysis ***  I have reviewed the labs.   Pertinent Imaging: ***  Assessment & Plan:  ***  1. Gross hematuria -UA ***  2. Scrotal pain ***  No follow-ups on file.  These notes generated with voice recognition software. I apologize for typographical errors.  Michiel Cowboy, PA-C  [facility] [address]  [address] [place], [place] [address] [phone]

## 2020-05-04 ENCOUNTER — Ambulatory Visit: Payer: Managed Care, Other (non HMO) | Admitting: Urology

## 2020-05-04 DIAGNOSIS — N5082 Scrotal pain: Secondary | ICD-10-CM

## 2020-05-04 DIAGNOSIS — R310 Gross hematuria: Secondary | ICD-10-CM

## 2020-05-10 ENCOUNTER — Ambulatory Visit: Payer: Managed Care, Other (non HMO) | Admitting: Urology

## 2020-05-11 ENCOUNTER — Emergency Department
Admission: EM | Admit: 2020-05-11 | Discharge: 2020-05-11 | Disposition: A | Discharge status: Home / Self Care | Payer: Managed Care, Other (non HMO) | Attending: Emergency Medicine | Admitting: Emergency Medicine

## 2020-05-11 ENCOUNTER — Other Ambulatory Visit: Payer: Self-pay

## 2020-05-11 ENCOUNTER — Encounter: Payer: Self-pay | Admitting: Emergency Medicine

## 2020-05-11 ENCOUNTER — Emergency Department: Payer: Managed Care, Other (non HMO)

## 2020-05-11 DIAGNOSIS — N23 Unspecified renal colic: Secondary | ICD-10-CM | POA: Diagnosis not present

## 2020-05-11 DIAGNOSIS — N200 Calculus of kidney: Secondary | ICD-10-CM | POA: Diagnosis not present

## 2020-05-11 DIAGNOSIS — R109 Unspecified abdominal pain: Secondary | ICD-10-CM | POA: Diagnosis present

## 2020-05-11 LAB — BASIC METABOLIC PANEL
Anion gap: 8 (ref 5–15)
BUN: 13 mg/dL (ref 6–20)
CO2: 25 mmol/L (ref 22–32)
Calcium: 9.1 mg/dL (ref 8.9–10.3)
Chloride: 102 mmol/L (ref 98–111)
Creatinine, Ser: 1.36 mg/dL — ABNORMAL HIGH (ref 0.61–1.24)
GFR, Estimated: >60 mL/min (ref >60)
Glucose, Bld: 111 mg/dL — ABNORMAL HIGH (ref 70–99)
Potassium: 3.9 mmol/L (ref 3.5–5.1)
Sodium: 135 mmol/L (ref 135–145)

## 2020-05-11 LAB — URINALYSIS, COMPLETE (UACMP) WITH MICROSCOPIC
Bacteria, UA: NONE SEEN
Bilirubin Urine: NEGATIVE
Glucose, UA: NEGATIVE mg/dL
Hgb urine dipstick: NEGATIVE
Ketones, ur: 5 mg/dL — AB
Leukocytes,Ua: NEGATIVE
Nitrite: NEGATIVE
Protein, ur: 30 mg/dL — AB
Specific Gravity, Urine: 1.029 (ref 1.005–1.030)
pH: 5 (ref 5.0–8.0)

## 2020-05-11 LAB — CBC
HCT: 38.7 % — ABNORMAL LOW (ref 39.0–52.0)
Hemoglobin: 13.6 g/dL (ref 13.0–17.0)
MCH: 29.9 pg (ref 26.0–34.0)
MCHC: 35.1 g/dL (ref 30.0–36.0)
MCV: 85.1 fL (ref 80.0–100.0)
Platelets: 235 10*3/uL (ref 150–400)
RBC: 4.55 MIL/uL (ref 4.22–5.81)
RDW: 14.5 % (ref 11.5–15.5)
WBC: 8.3 10*3/uL (ref 4.0–10.5)
nRBC: 0 % (ref 0.0–0.2)

## 2020-05-11 MED ORDER — ACETAMINOPHEN 500 MG PO TABS
1000.0000 mg | ORAL_TABLET | Freq: Once | ORAL | Status: AC
  Administered 2020-05-11: 1000 mg via ORAL
  Filled 2020-05-11: qty 2

## 2020-05-11 MED ORDER — TAMSULOSIN HCL 0.4 MG PO CAPS: 0.4000 mg | ORAL_CAPSULE | Freq: Every day | ORAL | 0 refills | Status: AC

## 2020-05-11 MED ORDER — KETOROLAC TROMETHAMINE 30 MG/ML IJ SOLN
15.0000 mg | Freq: Once | INTRAMUSCULAR | Status: AC
  Administered 2020-05-11: 15 mg via INTRAVENOUS
  Filled 2020-05-11: qty 1

## 2020-05-11 MED ORDER — ONDANSETRON 4 MG PO TBDP: 4.0000 mg | ORAL_TABLET | Freq: Three times a day (TID) | ORAL | 0 refills | Status: DC | PRN

## 2020-05-11 MED ORDER — OXYCODONE-ACETAMINOPHEN 5-325 MG PO TABS: 1.0000 | ORAL_TABLET | ORAL | 0 refills | Status: DC | PRN

## 2020-05-11 MED ORDER — IBUPROFEN 600 MG PO TABS: 600.0000 mg | ORAL_TABLET | Freq: Four times a day (QID) | ORAL | 0 refills | Status: AC | PRN

## 2020-05-11 MED ORDER — OXYCODONE HCL 5 MG PO TABS
5.0000 mg | ORAL_TABLET | Freq: Once | ORAL | Status: AC
Start: 2020-05-11 — End: 2020-05-11
  Administered 2020-05-11: 5 mg via ORAL
  Filled 2020-05-11: qty 1

## 2020-05-11 MED ORDER — ONDANSETRON HCL 4 MG/2ML IJ SOLN
4.0000 mg | Freq: Once | INTRAMUSCULAR | Status: AC
  Administered 2020-05-11: 4 mg via INTRAVENOUS
  Filled 2020-05-11: qty 2

## 2020-05-11 MED ORDER — TAMSULOSIN HCL 0.4 MG PO CAPS
0.4000 mg | ORAL_CAPSULE | Freq: Once | ORAL | Status: AC
  Administered 2020-05-11: 0.4 mg via ORAL
  Filled 2020-05-11: qty 1

## 2020-05-11 MED ORDER — PHENAZOPYRIDINE HCL 200 MG PO TABS
200.0000 mg | ORAL_TABLET | Freq: Once | ORAL | Status: AC
  Administered 2020-05-11: 200 mg via ORAL
  Filled 2020-05-11: qty 1

## 2020-05-11 NOTE — ED Triage Notes (Signed)
Pt to ED from home c/o right flank pain that started suddenly around 0100 this morning, nausea with vomiting 30 minutes PTA.  Pt states decreased urine output, painful urination, denies hematuria.  Pt presents pale, diaphoretic, A&Ox4.

## 2020-05-11 NOTE — ED Notes (Signed)
Pt reports pain is sharp on left side, intermittent. Requesting pain medication. Alerted EDP, verbal orders recevied.

## 2020-05-11 NOTE — ED Notes (Signed)
Wave of pain subsided. Pt was able to urinate and is tolerating PO fluids at this time.

## 2020-05-11 NOTE — ED Notes (Signed)
Assumed care of pt upon being roomed. Reports recent dx of kidney stones and has had sudden onset of worsening R flank pain. +Nausea. Denies hematuria. Reports dysuria and burning sensation to meatus. AO x4. Talks in full sentences with regular and unlabored breathing. Pt provided UA, awaiting results

## 2020-05-11 NOTE — ED Notes (Signed)
Pt alert and oriented X 4, stable for discharge. RR even and unlabored, color WNL. Discussed discharge instructions and follow-up as directed. Discharge medications discussed if prescribed. Pt had opportunity to ask questions, and RN to provide patient/family eduction.  

## 2020-05-11 NOTE — Discharge Instructions (Addendum)
You have been seen in the Emergency Department (ED)  Today and was diagnosed with kidney stones. While the stone is traveling through the ureter, which is the tube that carries urine from the kidney to the bladder, you will probably feel pain. The pain may be mild or very severe. You may also have some blood in your urine. As soon as the stone reaches the bladder, any intense pain should go away. If a stone is too large to pass on its own, you may need a medical procedure to help you pass the stone.   As we have discussed, please drink plenty of fluids and use a urinary strainer to attempt to capture the stone.  Please make a follow up appointment with Urology in the next week by calling the number below and bring the stone with you.  Pain control: Take ibuprofen 600mg every 6 hours for the pain. If the pain is not well controlled with ibuprofen you may take one percocet every 4 hours. Do not take tylenol while taking percocet. Please also take your prescribed flomax daily.   Follow-up with your doctor or return to the ER in 12-24 hours if your pain is not well controlled, if you develop pain or burning with urination, or if you develop a fever. Otherwise follow up in 3-5 days with your doctor.  When should you call for help?  Call your doctor now or seek immediate medical care if:  You cannot keep down fluids.  Your pain gets worse.  You have a fever or chills.  You have new or worse pain in your back just below your rib cage (the flank area).  You have new or more blood in your urine. You have pain or burning with urination You are unable to urinate You have abdominal pain  Watch closely for changes in your health, and be sure to contact your doctor if:  You do not get better as expected  How can you care for yourself at home?  Drink plenty of fluids, enough so that your urine is light yellow or clear like water. If you have kidney, heart, or liver disease and have to limit fluids, talk with  your doctor before you increase the amount of fluids you drink.  Take pain medicines exactly as directed. Call your doctor if you think you are having a problem with your medicine.  If the doctor gave you a prescription medicine for pain, take it as prescribed.  If you are not taking a prescription pain medicine, ask your doctor if you can take an over-the-counter medicine. Read and follow all instructions on the label. Your doctor may ask you to strain your urine so that you can collect your kidney stone when it passes. You can use a kitchen strainer or a tea strainer to catch the stone. Store it in a plastic bag until you see your doctor again.  Preventing future kidney stones  Some changes in your diet may help prevent kidney stones. Depending on the cause of your stones, your doctor may recommend that you:  Drink plenty of fluids, enough so that your urine is light yellow or clear like water. If you have kidney, heart, or liver disease and have to limit fluids, talk with your doctor before you increase the amount of fluids you drink.  Limit coffee, tea, and alcohol. Also avoid grapefruit juice.  Do not take more than the recommended daily dose of vitamins C and D.  Avoid antacids such as Gaviscon,   Maalox, Mylanta, or Tums.  Limit the amount of salt (sodium) in your diet.  Eat a balanced diet that is not too high in protein.  Limit foods that are high in a substance called oxalate, which can cause kidney stones. These foods include dark green vegetables, rhubarb, chocolate, wheat bran, nuts, cranberries, and beans.    

## 2020-05-11 NOTE — ED Notes (Signed)
Pt to CT

## 2020-05-11 NOTE — ED Notes (Signed)
Report given to Ally, RN 

## 2020-05-11 NOTE — ED Provider Notes (Signed)
[facility] Emergency Department Provider Note  ____________________________________________  Time seen: Approximately 5:57 AM  I have reviewed the triage vital signs and the nursing notes.   HISTORY  Chief Complaint Flank Pain   HPI Kenneth Fowler is a 35 y.o. male history of kidney stones who presents for evaluation of right-sided abdominal pain.  Patient reports the pain started at 1 AM.  Sudden sharp right flank pain radiating to the right groin associated with dysuria.  No hematuria.  Also has had nausea and nonbloody nonbilious emesis which started 30 minutes prior to arrival.  Is complaining of 10 out of 10 pain.  No fever or chills.   Patient was seen here 3 weeks ago for right-sided abdominal pain and hematuria.  At that point he was diagnosed with nephrolithiasis and a UTI.  He fully recovered from that episode.  He denies any penile discharge, scrotum pain or swelling.  PMH Kidney stones  Past Surgical History:  Procedure Laterality Date  . CLEFT PALATE REPAIR      Prior to Admission medications   Medication Sig Start Date End Date Taking? Authorizing Provider  ibuprofen (ADVIL) 600 MG tablet Take 1 tablet (600 mg total) by mouth every 6 (six) hours as needed. 05/11/20  Yes Don Perking, [place], [place]  ondansetron (ZOFRAN ODT) 4 MG disintegrating tablet Take 1 tablet (4 mg total) by mouth every 8 (eight) hours as needed. 05/11/20  Yes Thresea Doble, [place], MD  oxyCODONE-acetaminophen (PERCOCET) 5-325 MG tablet Take 1 tablet by mouth every 4 (four) hours as needed. 05/11/20  Yes Don Perking, [place], MD  tamsulosin (FLOMAX) 0.4 MG CAPS capsule Take 1 capsule (0.4 mg total) by mouth daily for 7 days. 05/11/20 05/18/20 Yes Jai Bear, [place], MD  sulfamethoxazole-trimethoprim (BACTRIM DS) 800-160 MG tablet Take 1 tablet by mouth 2 (two) times daily. 04/22/20   Joni Reining, PA-C    Allergies Bee venom and Codeine  History reviewed. No pertinent family  history.  Social History Social History   Tobacco Use  . Smoking status: Never Smoker  . Smokeless tobacco: Never Used  Substance Use Topics  . Alcohol use: No  . Drug use: No    Review of Systems  Constitutional: Negative for fever. Eyes: Negative for visual changes. ENT: Negative for sore throat. Neck: No neck pain  Cardiovascular: Negative for chest pain. Respiratory: Negative for shortness of breath. Gastrointestinal: Negative for abdominal pain or diarrhea. + vomiting Genitourinary: + dysuria and R flank pain Musculoskeletal: Negative for back pain. Skin: Negative for rash. Neurological: Negative for headaches, weakness or numbness. Psych: No SI or HI  ____________________________________________   PHYSICAL EXAM:  VITAL SIGNS: ED Triage Vitals  Enc Vitals Group     BP 05/11/20 0534 118/83     Pulse Rate 05/11/20 0534 72     Resp 05/11/20 0534 16     Temp 05/11/20 0534 98.1 F (36.7 C)     Temp Source 05/11/20 0534 Oral     SpO2 05/11/20 0534 99 %     Weight 05/11/20 0535 145 lb (65.8 kg)     Height 05/11/20 0535 6' (1.829 m)     Head Circumference --      Peak Flow --      Pain Score 05/11/20 0535 9     Pain Loc --      Pain Edu? --      Excl. in GC? --     Constitutional: Alert and oriented. Well appearing and in no  apparent distress. HEENT:      Head: Normocephalic and atraumatic.         Eyes: Conjunctivae are normal. Sclera is non-icteric.       Mouth/Throat: Mucous membranes are moist.       Neck: Supple with no signs of meningismus. Cardiovascular: Regular rate and rhythm. No murmurs, gallops, or rubs. 2+ symmetrical distal pulses are present in all extremities. No JVD. Respiratory: Normal respiratory effort. Lungs are clear to auscultation bilaterally.  Gastrointestinal: Soft, non tender, and non distended with positive bowel sounds. No rebound or guarding. Genitourinary: Bilateral testicles are descended with no tenderness to palpation,  bilateral positive cremasteric reflexes are present, no swelling or erythema of the scrotum. No evidence of inguinal hernia. Musculoskeletal:  No edema, cyanosis, or erythema of extremities. Neurologic: Normal speech and language. Face is symmetric. Moving all extremities. No gross focal neurologic deficits are appreciated. Skin: Skin is warm, dry and intact. No rash noted. Psychiatric: Mood and affect are normal. Speech and behavior are normal.  ____________________________________________   LABS (all labs ordered are listed, but only abnormal results are displayed)  Labs Reviewed  URINALYSIS, COMPLETE (UACMP) WITH MICROSCOPIC - Abnormal; Notable for the following components:      Result Value   Color, Urine YELLOW (*)    APPearance CLEAR (*)    Ketones, ur 5 (*)    Protein, ur 30 (*)    All other components within normal limits  BASIC METABOLIC PANEL - Abnormal; Notable for the following components:   Glucose, Bld 111 (*)    Creatinine, Ser 1.36 (*)    All other components within normal limits  CBC - Abnormal; Notable for the following components:   HCT 38.7 (*)    All other components within normal limits   ____________________________________________  EKG  none  ____________________________________________  RADIOLOGY  I have personally reviewed the images performed during this visit and I agree with the Radiologist's read.   Interpretation by Radiologist:  CT Renal Stone Study  Result Date: 05/11/2020 CLINICAL DATA:  35 year old male with sudden onset right flank pain 0100 hours. Painful urination. EXAM: CT ABDOMEN AND PELVIS WITHOUT CONTRAST TECHNIQUE: Multidetector CT imaging of the abdomen and pelvis was performed following the standard protocol without IV contrast. COMPARISON:  CT Abdomen and Pelvis 04/22/2020. FINDINGS: Lower chest: Negative. Hepatobiliary: Negative noncontrast liver and gallbladder. Pancreas: Negative. Spleen: Negative. Adrenals/Urinary Tract:  Normal adrenal glands. Nonobstructive left kidney with punctate left upper pole nephrolithiasis. Decompressed left ureter. Newly hydronephrotic right kidney. Punctate right midpole and 2-3 mm right lower pole calculus. Right hydroureter continuing into the pelvis. 3 mm distal right ureteral calculus (series 2, image 74) located at the right ureterovesical junction. Decompressed bladder. Stomach/Bowel: Negative. Normal appendix arises from the cecum on series 2, image 66. Mildly redundant large bowel. No free air, free fluid. Vascular/Lymphatic: Normal caliber abdominal aorta. No calcified atherosclerosis. No lymphadenopathy. Reproductive: Stable, negative. Other: No pelvic free fluid.  Stable left hemipelvis phlebolith. Musculoskeletal: No acute osseous abnormality identified. 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 No acute or suspicious osseous lesion. IMPRESSION: 1. Acute obstructive uropathy on the right due to a 3 mm distal right ureteral calculus located at the UVJ. 2. Bilateral nephrolithiasis. 3. 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 Electronically Signed   By: Odessa Fleming M.D.   On: 05/11/2020 06:44     ____________________________________________   PROCEDURES  Procedure(s) performed: None Procedures  Critical Care performed:  None ____________________________________________   INITIAL IMPRESSION / ASSESSMENT AND PLAN / ED COURSE  35 y.o. male history of kidney stones who presents for evaluation of right-sided flank pain, dysuria, nausea and vomiting.  Patient is well-appearing in no distress with normal vitals, abdomen soft and nontender, GU exam is normal, mild CVA tenderness.  DDx UTI versus pyelonephritis versus STD versus kidney stone versus appendicitis  We will treat symptoms with Toradol and Zofran.  Will get labs and urinalysis old medical records reviewed including patient CT from 3 weeks ago showing  bilateral kidney stones.  _________________________ 6:54 AM on 05/11/2020 ----------------------------------------- CT consistent with a 3 mm distal UVJ stone with no overlying urinary tract infection.  Pain improved with IV Toradol.  Will give p.o. Tylenol, p.o. oxycodone, and Flomax and discharged home with dietary changes, increase oral hydration, pain control.  Discussed my standard return precautions for pain that is now well controlled at home, or signs of urinary tract infection     _____________________________________________ Please note:  Patient was evaluated in Emergency Department today for the symptoms described in the history of present illness. Patient was evaluated in the context of the global COVID-19 pandemic, which necessitated consideration that the patient might be at risk for infection with the SARS-CoV-2 virus that causes COVID-19. Institutional protocols and algorithms that pertain to the evaluation of patients at risk for COVID-19 are in a state of rapid change based on information released by regulatory bodies including the CDC and federal and state organizations. These policies and algorithms were followed during the patient's care in the ED.  Some ED evaluations and interventions may be delayed as a result of limited staffing during the pandemic.   [place] Controlled Substance Database was reviewed by me. ____________________________________________   FINAL CLINICAL IMPRESSION(S) / ED DIAGNOSES   Final diagnoses:  Kidney stone  Renal colic      NEW MEDICATIONS STARTED DURING THIS VISIT:  ED Discharge Orders         Ordered    tamsulosin (FLOMAX) 0.4 MG CAPS capsule  Daily        05/11/20 0653    ibuprofen (ADVIL) 600 MG tablet  Every 6 hours PRN        05/11/20 0653    oxyCODONE-acetaminophen (PERCOCET) 5-325 MG tablet  Every 4 hours PRN        05/11/20 0653    ondansetron (ZOFRAN ODT) 4 MG disintegrating tablet  Every 8 hours PRN        05/11/20 4696            Note:  This document was prepared using Dragon voice recognition software and may include unintentional dictation errors.    Don Perking, [place], MD 05/11/20 [phone]

## 2020-05-12 LAB — URINE CULTURE: Culture: NO GROWTH

## 2021-04-23 NOTE — Progress Notes (Addendum)
? ?04/24/21 ?12:42 PM  ? ?Kenneth Fowler ?October 28, 1985 ?[id number] ? ?Referring provider:  ?No referring provider defined for this encounter. ?Chief Complaint  ?Patient presents with  ? Nephrolithiasis  ? ? ? ?HPI: ?Kenneth Fowler is a 36 y.o.male  with a personal history of kidney stones, right lower quadrant pain, microscopic and gross hematuria, who presents today for a 1 year follow-up with KUB. ? ?CT abdomen and pelvis with contrast on 04/27/2020 visualized bilateral punctate stones but no ureteral stones or hydronephrosis.  ? ?He was seen in the ED on 05/11/2020 for flank pain. CT renal stone study visualized Acute obstructive uropathy on the right due to a 3 mm distal right ureteral calculus located at the UVJ. Bilateral nephrolithiasis. He was discharged on Flomax. ? ?KUB was personally reviewed today and compared to previous CT scan. No stones are appreciated today view was limited by bowel gas. 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  ? ?He reports that in October 2022 he experienced pain that was similar to stone pain. He thinks he passed a stone during this episode. He reports retrograde ejaculation associated with Flomax use which was concerning for him. ? ?He reports that he does not drink dark sodas or coffee anymore. He drinks water, sprite and he drinks beer every other day.  ? ?PMH: ?No past medical history on file. ? ?Surgical History: ?Past Surgical History:  ?Procedure Laterality Date  ? CLEFT PALATE REPAIR    ? ? ?Home Medications:  ?Allergies as of 04/24/2021   ? ?   Reactions  ? Bee Venom Swelling  ? Codeine Other (See Comments)  ? Pt states he has a hx of addiction in family.  ? ?  ? ?  ?Medication List  ?  ? ?  ? Accurate as of April 24, 2021 12:42 PM. If you have any questions, ask your nurse or doctor.  ?  ?  ? ?  ? ?STOP taking these medications   ? ?ondansetron 4 MG disintegrating tablet ?Commonly known as: Zofran ODT ?Stopped by: Vanna Scotland, MD ?  ?oxyCODONE-acetaminophen 5-325 MG  tablet ?Commonly known as: Percocet ?Stopped by: Vanna Scotland, MD ?  ?sulfamethoxazole-trimethoprim 800-160 MG tablet ?Commonly known as: BACTRIM DS ?Stopped by: Vanna Scotland, MD ?  ? ?  ? ?TAKE these medications   ? ?ibuprofen 600 MG tablet ?Commonly known as: ADVIL ?Take 1 tablet (600 mg total) by mouth every 6 (six) hours as needed. ?  ? ?  ? ? ?Allergies:  ?Allergies  ?Allergen Reactions  ? Bee Venom Swelling  ? Codeine Other (See Comments)  ?  Pt states he has a hx of addiction in family.  ? ? ?Family History: ?No family history on file. ? ?Social History:  reports that he has never smoked. He has never used smokeless tobacco. He reports that he does not drink alcohol and does not use drugs. ? ? ?Physical Exam: ?BP 116/80   Pulse 81   Ht 6' (1.829 m)   Wt 150 lb (68 kg)   BMI 20.34 kg/m?   ?Constitutional:  Alert and oriented, No acute distress. ?HEENT: [place] AT, moist mucus membranes.  Trachea midline, no masses. ?Cardiovascular: No clubbing, cyanosis, or edema. ?Respiratory: Normal respiratory effort, no increased work of breathing. ?Skin: No rashes, bruises or suspicious lesions. ?Neurologic: Grossly intact, no focal deficits, moving all 4 extremities. ?Psychiatric: Normal mood and affect. ? ?Laboratory Data: ?Lab Results  ?Component Value Date  ? CREATININE 1.36 (H) 05/11/2020  ? ? ?  Pertinent Imaging: ?KUB was personally reviewed an interpreted today, the stone burden appreciated ? ?Assessment & Plan:   ?History of kidney stones  ?- Most recent episode of flank pain in October 2022 likely related to interval passage of a stone. Stones are not appreciated on KUB today ?- No further imaging is warranted at this point. Discussed that if he experiences severe or persistent pain that he should follow-up for imaging.   Stones are likely present but obscured.  No obvious interval growth. ?- We discussed general stone prevention techniques including drinking plenty water with goal of producing 2.5 L urine  daily, increased citric acid intake, avoidance of high oxalate containing foods, and decreased salt intake.  Information about dietary recommendations given today.  ? ? ?2. Retrograde ejaculation  ?- Secondary to Flomax, was reassured ? ?Follow-up as needed ? ?Tawni Millers as a scribe for Vanna Scotland, MD.,have documented all relevant documentation on the behalf of Vanna Scotland, MD,as directed by  Vanna Scotland, MD while in the presence of Vanna Scotland, MD. ? ?I have reviewed the above documentation for accuracy and completeness, and I agree with the above.  ? ?Vanna Scotland, MD ? ? ?[facility] ?[address], [address] ?[place], [place] [address] ?[phone] ?

## 2021-04-24 ENCOUNTER — Other Ambulatory Visit: Payer: Self-pay

## 2021-04-24 ENCOUNTER — Ambulatory Visit
Admission: RE | Admit: 2021-04-24 | Discharge: 2021-04-24 | Disposition: A | Discharge status: Home / Self Care | Payer: BC Managed Care – PPO | Source: Ambulatory Visit | Attending: Urology | Admitting: Urology

## 2021-04-24 ENCOUNTER — Ambulatory Visit
Admission: RE | Admit: 2021-04-24 | Discharge: 2021-04-24 | Disposition: A | Discharge status: Home / Self Care | Payer: BC Managed Care – PPO | Attending: Urology | Admitting: Urology

## 2021-04-24 ENCOUNTER — Ambulatory Visit: Payer: Managed Care, Other (non HMO) | Admitting: Urology

## 2021-04-24 VITALS — BP 116/80 | HR 81 | Ht 72.0 in | Wt 150.0 lb

## 2021-04-24 DIAGNOSIS — N200 Calculus of kidney: Secondary | ICD-10-CM

## 2021-04-24 DIAGNOSIS — Z87442 Personal history of urinary calculi: Secondary | ICD-10-CM | POA: Diagnosis not present

## 2021-05-01 ENCOUNTER — Ambulatory Visit: Payer: Self-pay | Admitting: Urology

## 2022-11-22 ENCOUNTER — Ambulatory Visit: Payer: BC Managed Care – PPO | Admitting: Family Medicine

## 2022-11-29 ENCOUNTER — Ambulatory Visit
Admission: RE | Admit: 2022-11-29 | Discharge: 2022-11-29 | Disposition: A | Discharge status: Home / Self Care | Payer: BC Managed Care – PPO | Attending: Physician Assistant | Admitting: Physician Assistant

## 2022-11-29 ENCOUNTER — Ambulatory Visit
Admission: RE | Admit: 2022-11-29 | Discharge: 2022-11-29 | Disposition: A | Discharge status: Home / Self Care | Payer: BC Managed Care – PPO | Source: Ambulatory Visit | Attending: Physician Assistant

## 2022-11-29 ENCOUNTER — Other Ambulatory Visit: Payer: Self-pay

## 2022-11-29 ENCOUNTER — Ambulatory Visit: Payer: BC Managed Care – PPO | Admitting: Physician Assistant

## 2022-11-29 VITALS — BP 96/62 | HR 94

## 2022-11-29 DIAGNOSIS — Z87442 Personal history of urinary calculi: Secondary | ICD-10-CM

## 2022-11-29 DIAGNOSIS — N200 Calculus of kidney: Secondary | ICD-10-CM

## 2022-11-29 DIAGNOSIS — R109 Unspecified abdominal pain: Secondary | ICD-10-CM

## 2022-11-29 LAB — URINALYSIS, COMPLETE
Bilirubin, UA: NEGATIVE
Glucose, UA: NEGATIVE
Ketones, UA: NEGATIVE
Leukocytes,UA: NEGATIVE
Nitrite, UA: NEGATIVE
Protein,UA: NEGATIVE
RBC, UA: NEGATIVE
Specific Gravity, UA: 1.01 (ref 1.005–1.030)
Urobilinogen, Ur: 0.2 mg/dL (ref 0.2–1.0)
pH, UA: 6.5 (ref 5.0–7.5)

## 2022-11-29 LAB — MICROSCOPIC EXAMINATION
Bacteria, UA: NONE SEEN
RBC, Urine: NONE SEEN /[HPF] (ref 0–2)

## 2022-11-29 NOTE — Progress Notes (Signed)
11/29/2022 2:32 PM   Kenneth Fowler July 09, 1985 [id number]  CC: Chief Complaint  Patient presents with   Follow-up   HPI: Kenneth Fowler is a 37 y.o. male with PMH nephrolithiasis and hematuria who presents today for evaluation of a possible acute stone episode.  He is accompanied today by his wife, who contributes to HPI.   Today he reports a 1 to 58-month history of RLQ pain that radiates down his right leg.  He does have a history of sciatica.  He has stopped dark sodas for stone prevention.  The pain can be so severe it awakens him at night.  He denies fever, chills, nausea, vomiting, or gross hematuria.  KUB today with a possible distal left ureteral stone.  In-office UA and microscopy today pan negative.  PMH: No past medical history on file.  Surgical History: Past Surgical History:  Procedure Laterality Date   CLEFT PALATE REPAIR      Home Medications:  Allergies as of 11/29/2022       Reactions   Bee Venom Swelling   Codeine Other (See Comments)   Pt states he has a hx of addiction in family.        Medication List        Accurate as of November 29, 2022  2:32 PM. If you have any questions, ask your nurse or doctor.          ibuprofen 600 MG tablet Commonly known as: ADVIL Take 1 tablet (600 mg total) by mouth every 6 (six) hours as needed.        Allergies:  Allergies  Allergen Reactions   Bee Venom Swelling   Codeine Other (See Comments)    Pt states he has a hx of addiction in family.    Family History: No family history on file.  Social History:   reports that he has never smoked. He has never used smokeless tobacco. He reports that he does not drink alcohol and does not use drugs.  Physical Exam: BP 96/62   Pulse 94   Constitutional:  Alert and oriented, no acute distress, nontoxic appearing HEENT: [place], AT Cardiovascular: No clubbing, cyanosis, or edema Respiratory: Normal respiratory effort, no increased work of  breathing Skin: No rashes, bruises or suspicious lesions Neurologic: Grossly intact, no focal deficits, moving all 4 extremities Psychiatric: Normal mood and affect  Laboratory Data: Results for orders placed or performed in visit on 11/29/22  Microscopic Examination   Urine  Result Value Ref Range   WBC, UA 0-5 0 - 5 /hpf   RBC, Urine None seen 0 - 2 /hpf   Epithelial Cells (non renal) 0-10 0 - 10 /hpf   Bacteria, UA None seen None seen/Few  Urinalysis, Complete  Result Value Ref Range   Specific Gravity, UA 1.010 1.005 - 1.030   pH, UA 6.5 5.0 - 7.5   Color, UA Yellow Yellow   Appearance Ur Clear Clear   Leukocytes,UA Negative Negative   Protein,UA Negative Negative/Trace   Glucose, UA Negative Negative   Ketones, UA Negative Negative   RBC, UA Negative Negative   Bilirubin, UA Negative Negative   Urobilinogen, Ur 0.2 0.2 - 1.0 mg/dL   Nitrite, UA Negative Negative   Microscopic Examination See below:    Pertinent Imaging: KUB, 11/29/2022: CLINICAL DATA:  Kidney stones.   EXAM: ABDOMEN - 1 VIEW   COMPARISON:  04/24/2021   FINDINGS: The bowel gas pattern is normal. No radio-opaque calculi or other significant  radiographic abnormality are seen.   IMPRESSION: Negative.     Electronically Signed   By: Layla Maw M.D.   On: 12/22/2022 20:48  I personally reviewed the images referenced above and a possible distal left ureteral stone.  Assessment & Plan:   1. Flank pain with history of urolithiasis UA today is bland and I do not see any right-sided urolithiasis, however there may be a small stone in the distal left ureter.  I think it would be unlikely to be causing RLQ pain unless this were referred.  I recommended a CT stone study for further evaluation, but was honest with them that I think more likely this is musculoskeletal especially given reports of radiation down the right lower extremity. - Urinalysis, Complete - CULTURE, URINE COMPREHENSIVE - CT  RENAL STONE STUDY; Future   Return for Will call with results.  Carman Ching, PA-C  [facility] [address] [place], [place] [address] [phone]

## 2022-12-02 LAB — CULTURE, URINE COMPREHENSIVE

## 2022-12-09 ENCOUNTER — Ambulatory Visit
Admission: RE | Admit: 2022-12-09 | Discharge: 2022-12-09 | Disposition: A | Discharge status: Home / Self Care | Payer: BC Managed Care – PPO | Source: Ambulatory Visit | Attending: Physician Assistant | Admitting: Physician Assistant

## 2022-12-09 DIAGNOSIS — R109 Unspecified abdominal pain: Secondary | ICD-10-CM | POA: Diagnosis present

## 2022-12-09 DIAGNOSIS — Z87442 Personal history of urinary calculi: Secondary | ICD-10-CM | POA: Insufficient documentation

## 2022-12-11 ENCOUNTER — Ambulatory Visit: Payer: Self-pay

## 2022-12-11 NOTE — Telephone Encounter (Signed)
    Chief Complaint: Right lower abdominal pain Symptoms: pain Frequency: 2 months ago Pertinent Negatives: Patient denies any other symptoms. Urology has ordered imaging, waiting on CT results. Disposition: [x] ED /[] Urgent Care (no appt availability in office) / [] Appointment(In office/virtual)/ []  [facility]/ [] Home Care/ [] Refused Recommended Disposition /[] [facility]/ []  Follow-up with PCP Additional Notes: Pt. Agrees to go to ED.  Reason for Disposition  Patient sounds very sick or weak to the triager  Answer Assessment - Initial Assessment Questions 1. LOCATION: "Where does it hurt?"      Right lower side 2. RADIATION: "Does the pain shoot anywhere else?" (e.g., chest, back)     No 3. ONSET: "When did the pain begin?" (Minutes, hours or days ago)      2 months ago 4. SUDDEN: "Gradual or sudden onset?"     Gradual 5. PATTERN "Does the pain come and go, or is it constant?"    - If it comes and goes: "How long does it last?" "Do you have pain now?"     (Note: Comes and goes means the pain is intermittent. It goes away completely between bouts.)    - If constant: "Is it getting better, staying the same, or getting worse?"      (Note: Constant means the pain never goes away completely; most serious pain is constant and gets worse.)      Comes and goes 6. SEVERITY: "How bad is the pain?"  (e.g., Scale 1-10; mild, moderate, or severe)    - MILD (1-3): Doesn't interfere with normal activities, abdomen soft and not tender to touch.     - MODERATE (4-7): Interferes with normal activities or awakens from sleep, abdomen tender to touch.     - SEVERE (8-10): Excruciating pain, doubled over, unable to do any normal activities.       5 7. RECURRENT SYMPTOM: "Have you ever had this type of stomach pain before?" If Yes, ask: "When was the last time?" and "What happened that time?"      Yes 8. CAUSE: "What do you think is causing the stomach pain?"     Maybe a kidney  stone 9. RELIEVING/AGGRAVATING FACTORS: "What makes it better or worse?" (e.g., antacids, bending or twisting motion, bowel movement)     No 10. OTHER SYMPTOMS: "Do you have any other symptoms?" (e.g., back pain, diarrhea, fever, urination pain, vomiting)       No  Protocols used: Abdominal Pain - Male-A-AH

## 2022-12-24 ENCOUNTER — Telehealth: Payer: Self-pay | Admitting: General Practice

## 2022-12-24 NOTE — Telephone Encounter (Signed)
I spoke with pt; about 2 months ago pt started with right sided upper and  lower abd pain that is a dull achy intermittent pain: on 12/23/22 pt said the pain was more severe most of the day at pain level of 8. Pt said now slight lower rt sided dull pain at pain level of 4 - 5. Pt said he is urinating OK and normal BMs. No fever and no N&V.Pt said he has a kidney stone but has not been messaged back from urologist.I advised pt to call urology office instead of messaging since no response and pt voiced understanding. Pt has not been seen at [facility] and pt was advised if pt condition changes or worsens to go to [facility] or ED. Pt voiced understanding. Pt said he does not have a PCP at this time. Pt hopes can  be seen sooner at [facility] before 01/17/23 at 3 PM. Note has been sent to Orvilla Fus NP. Sending this update to B Evelene Croon NP as well.sending note to Dr Ermalene Searing who is in office today also.

## 2022-12-24 NOTE — Telephone Encounter (Signed)
Spoke with pt about r/s new pt appt on Thurs, 12/5. Pt states he feels as if he's about to die. Pt stated he's been having bad right abdominal pain along with extreme acid reflux. Pt mentioned passing kidney stones as well. Pt stated our office was the fourth office today, that's called to r/s appt. Pt stated he's currently seeing a urologist, but they cannot seem to find the issue. Pt asked could be been squeezed in sooner? Pt is r/s to 12/13 @ 3pm with Evelene Croon. Routing message to [place] for triage & Evelene Croon. Call back # [phone]

## 2022-12-24 NOTE — Telephone Encounter (Signed)
Noted  

## 2022-12-25 NOTE — Telephone Encounter (Signed)
Noted  

## 2023-01-09 ENCOUNTER — Ambulatory Visit: Payer: BC Managed Care – PPO | Admitting: General Practice

## 2023-01-17 ENCOUNTER — Ambulatory Visit: Payer: BC Managed Care – PPO | Admitting: General Practice

## 2023-01-17 IMAGING — CT CT RENAL STONE PROTOCOL
2 of 4 series · 16 of 46 positions shown, 18 images · non-contrast
Comparison: CT Abdomen and Pelvis 04/22/2020.

CLINICAL DATA: 34-year-old male with sudden onset right flank pain
6366 hours. Painful urination.

EXAM:
CT ABDOMEN AND PELVIS WITHOUT CONTRAST
TECHNIQUE: Multidetector CT imaging of the abdomen and pelvis was performed
following the standard protocol without IV contrast.

[Series 2: stone full standard · axial · 0.77mm/px · z∈[-501,-76]mm · 13 of 95 slices shown, 15 images]
[im 5/95  soft-tissue]
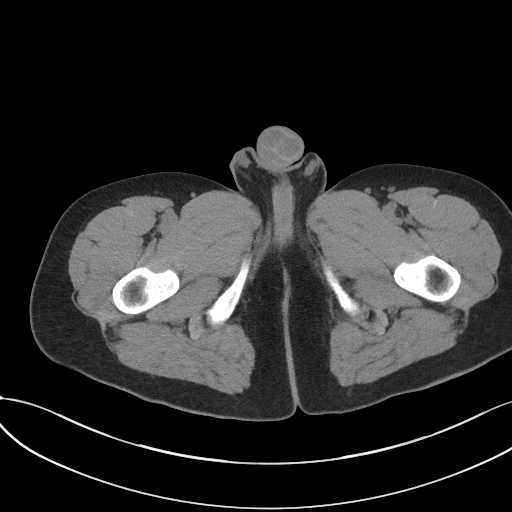
[im 5/95  bone]
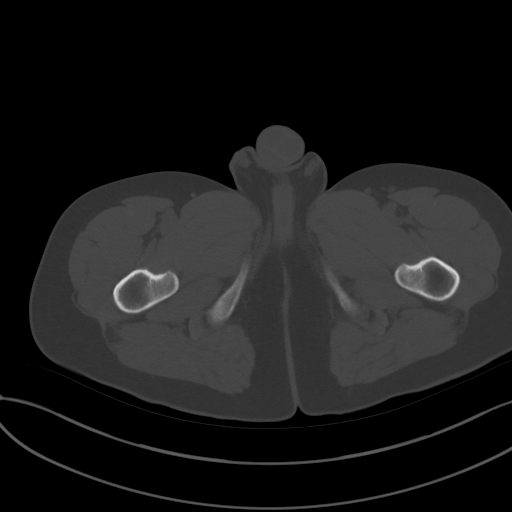
[im 14/95  soft-tissue]
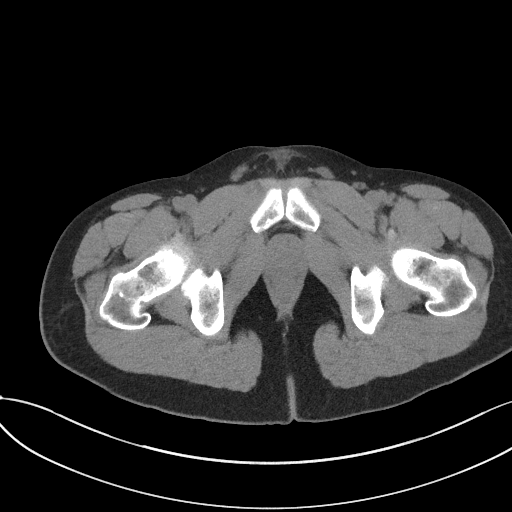
[im 18/95  soft-tissue]
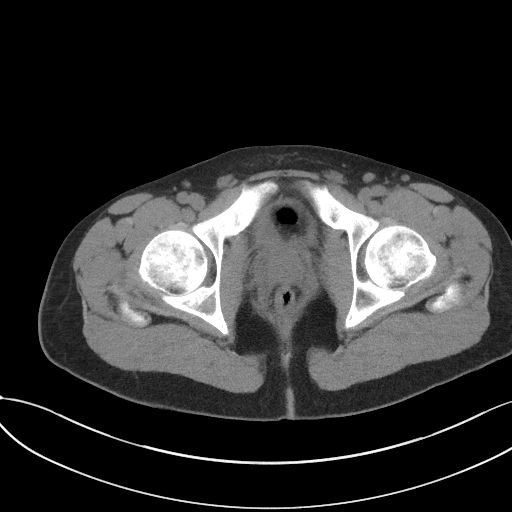
[im 27/95  soft-tissue]
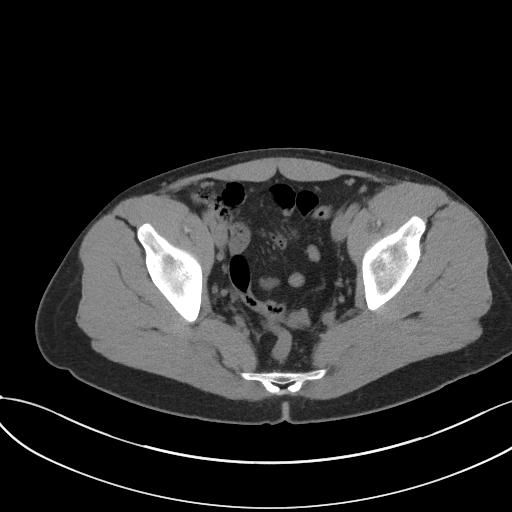
[im 32/95  soft-tissue]
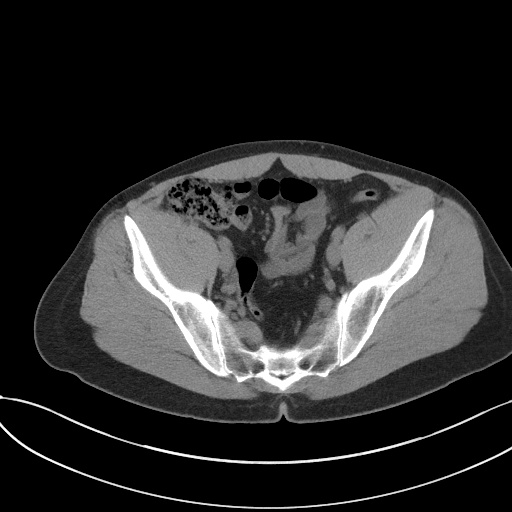
[im 41/95  soft-tissue]
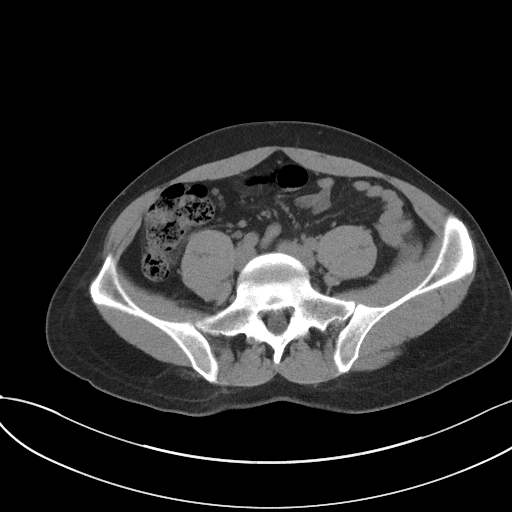
[im 50/95  soft-tissue]
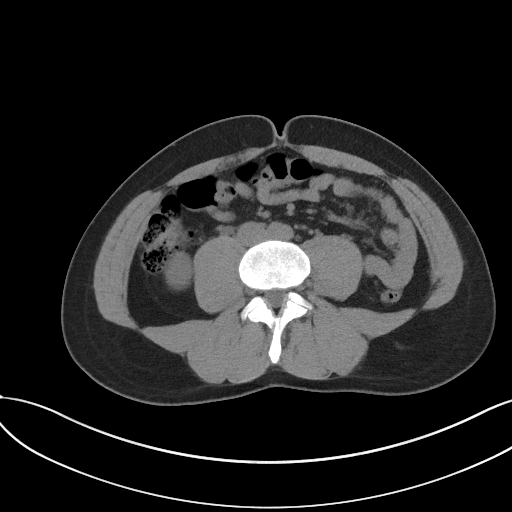
[im 54/95  soft-tissue]
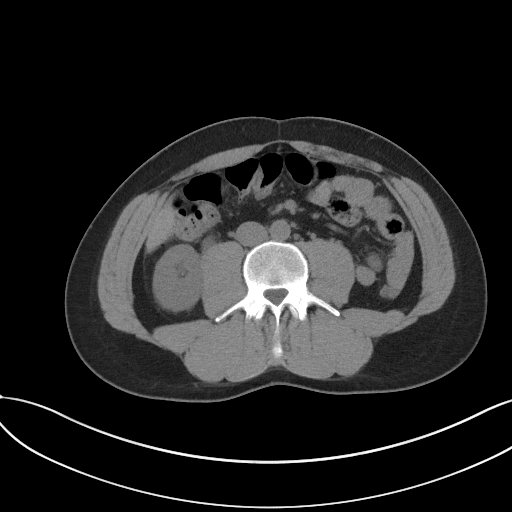
[im 63/95  soft-tissue]
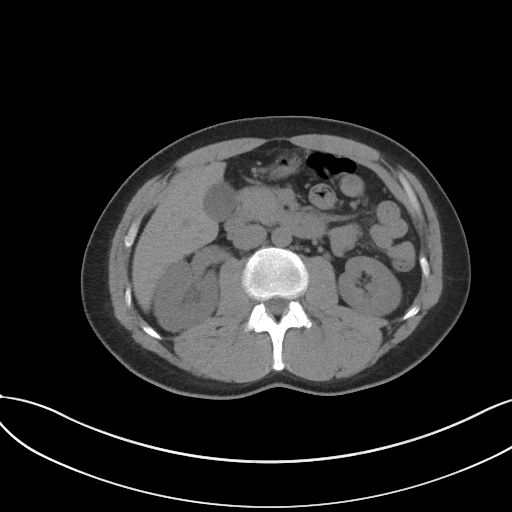
[im 63/95  bone]
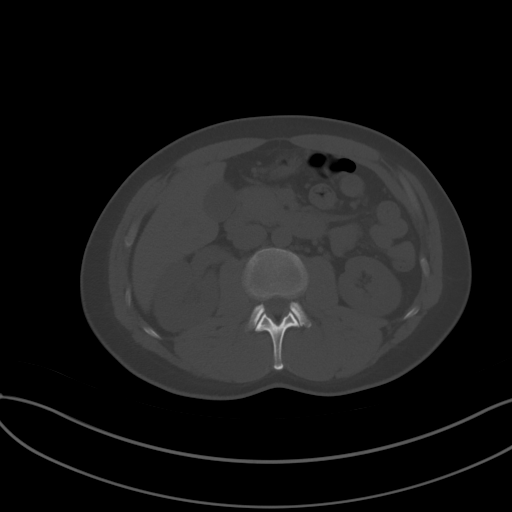
[im 68/95  soft-tissue]
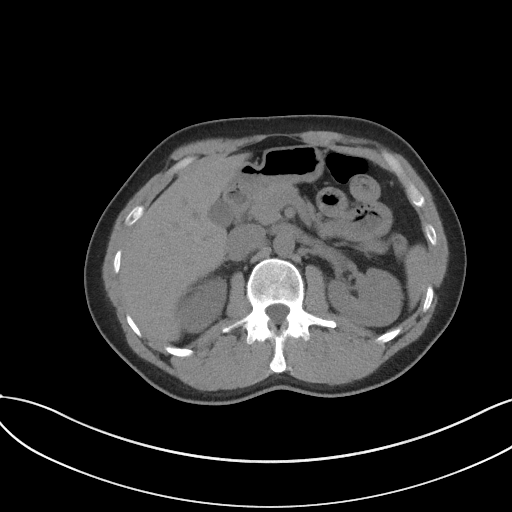
[im 77/95  soft-tissue]
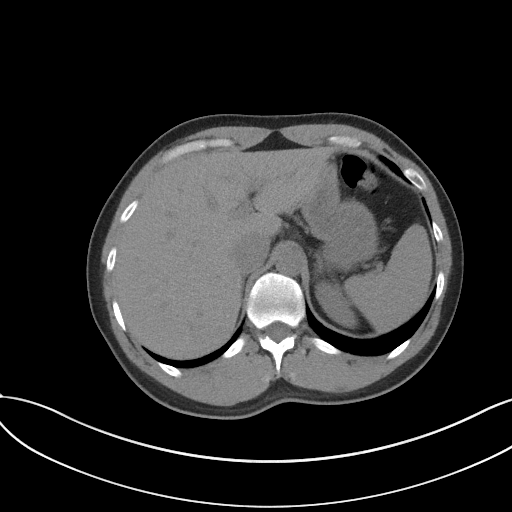
[im 81/95  soft-tissue]
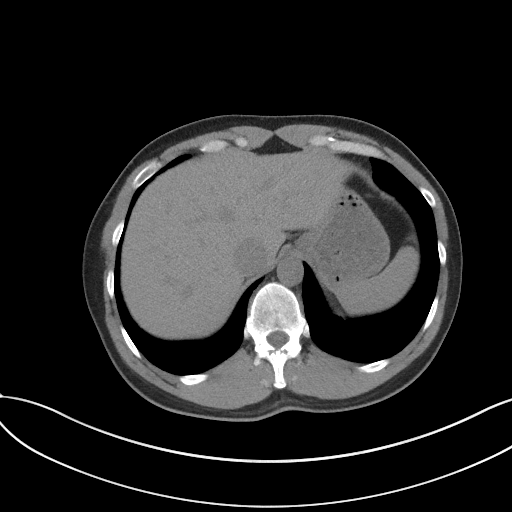
[im 90/95  soft-tissue]
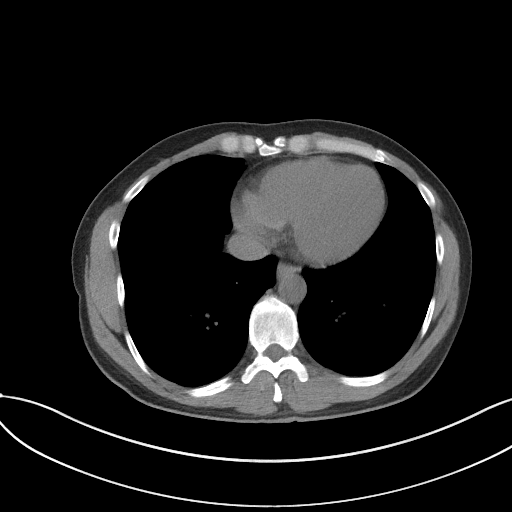

[Series 5: coronal · coronal · 0.69mm/px · 3 of 122 slices shown]
[im 41/122  soft-tissue]
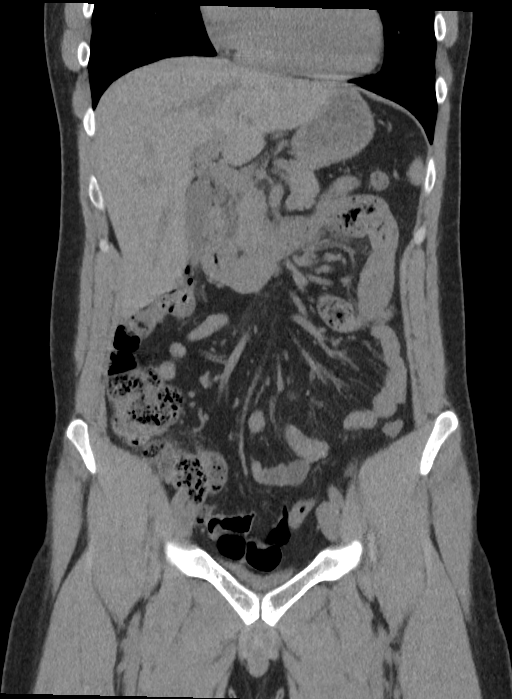
[im 54/122  soft-tissue]
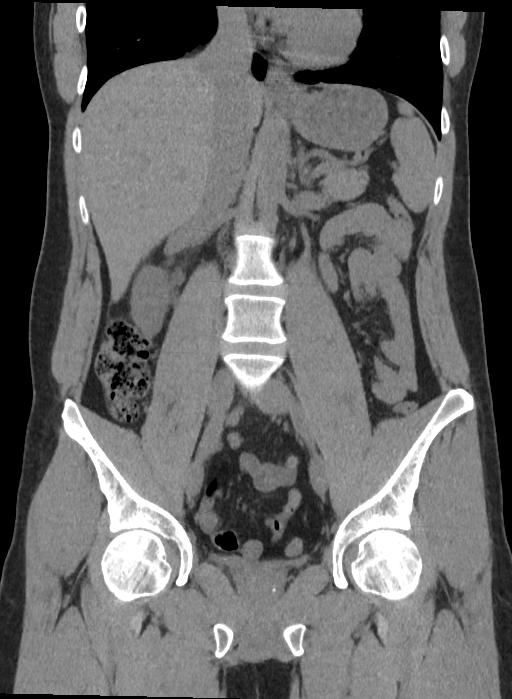
[im 68/122  soft-tissue]
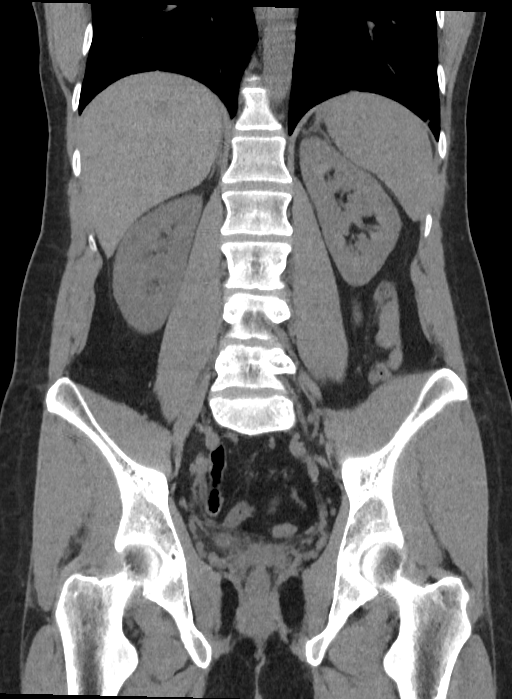

[16 of 46 positions shown; findings below may reference images not displayed]

FINDINGS: Lower chest: Negative.

Hepatobiliary: Negative noncontrast liver and gallbladder.

Pancreas: Negative.

Spleen: Negative.

Adrenals/Urinary Tract: Normal adrenal glands. Nonobstructive left
kidney with punctate left upper pole nephrolithiasis. Decompressed
left ureter.

Newly hydronephrotic right kidney. Punctate right midpole and 2-3 mm
right lower pole calculus. Right hydroureter continuing into the
pelvis. 3 mm distal right ureteral calculus (series 2, image 74)
located at the right ureterovesical junction. Decompressed bladder.

Stomach/Bowel: Negative. Normal appendix arises from the cecum on
series 2, image 66. Mildly redundant large bowel. No free air, free
fluid.

Vascular/Lymphatic: Normal caliber abdominal aorta. No calcified
atherosclerosis. No lymphadenopathy.

Reproductive: Stable, negative.

Other: No pelvic free fluid.  Stable left hemipelvis phlebolith.

Musculoskeletal: No acute osseous abnormality identified. Small
osteochondroma of the right iliac wing suspected on series 2, image
59 and stable from last month. No acute or suspicious osseous
lesion.
IMPRESSION: 1. Acute obstructive uropathy on the right due to a 3 mm distal
right ureteral calculus located at the UVJ.
2. Bilateral nephrolithiasis.
3. Small benign osteochondroma of the right iliac wing.

## 2023-11-17 ENCOUNTER — Emergency Department
Admission: EM | Admit: 2023-11-17 | Discharge: 2023-11-17 | Disposition: A | Discharge status: Home / Self Care | Attending: Emergency Medicine | Admitting: Emergency Medicine

## 2023-11-17 ENCOUNTER — Emergency Department

## 2023-11-17 DIAGNOSIS — K529 Noninfective gastroenteritis and colitis, unspecified: Secondary | ICD-10-CM | POA: Diagnosis not present

## 2023-11-17 DIAGNOSIS — D72829 Elevated white blood cell count, unspecified: Secondary | ICD-10-CM | POA: Insufficient documentation

## 2023-11-17 DIAGNOSIS — R1031 Right lower quadrant pain: Secondary | ICD-10-CM | POA: Diagnosis present

## 2023-11-17 LAB — PROTIME-INR
INR: 1 (ref 0.8–1.2)
Prothrombin Time: 13.9 s (ref 11.4–15.2)

## 2023-11-17 LAB — COMPREHENSIVE METABOLIC PANEL WITH GFR
ALT: 33 U/L (ref 0–44)
AST: 35 U/L (ref 15–41)
Albumin: 4.9 g/dL (ref 3.5–5.0)
Alkaline Phosphatase: 69 U/L (ref 38–126)
Anion gap: 13 (ref 5–15)
BUN: 16 mg/dL (ref 6–20)
CO2: 26 mmol/L (ref 22–32)
Calcium: 10 mg/dL (ref 8.9–10.3)
Chloride: 99 mmol/L (ref 98–111)
Creatinine, Ser: 1.18 mg/dL (ref 0.61–1.24)
GFR, Estimated: >60 mL/min (ref >60)
Glucose, Bld: 120 mg/dL — ABNORMAL HIGH (ref 70–99)
Potassium: 4.1 mmol/L (ref 3.5–5.1)
Sodium: 138 mmol/L (ref 135–145)
Total Bilirubin: 0.6 mg/dL (ref 0.0–1.2)
Total Protein: 8.3 g/dL — ABNORMAL HIGH (ref 6.5–8.1)

## 2023-11-17 LAB — CBC
HCT: 43.9 % (ref 39.0–52.0)
Hemoglobin: 14.8 g/dL (ref 13.0–17.0)
MCH: 29.2 pg (ref 26.0–34.0)
MCHC: 33.7 g/dL (ref 30.0–36.0)
MCV: 86.6 fL (ref 80.0–100.0)
Platelets: 280 K/uL (ref 150–400)
RBC: 5.07 MIL/uL (ref 4.22–5.81)
RDW: 14.8 % (ref 11.5–15.5)
WBC: 14 K/uL — ABNORMAL HIGH (ref 4.0–10.5)
nRBC: 0 % (ref 0.0–0.2)

## 2023-11-17 LAB — URINALYSIS, ROUTINE W REFLEX MICROSCOPIC
Bilirubin Urine: NEGATIVE
Glucose, UA: NEGATIVE mg/dL
Hgb urine dipstick: NEGATIVE
Ketones, ur: NEGATIVE mg/dL
Leukocytes,Ua: NEGATIVE
Nitrite: NEGATIVE
Protein, ur: NEGATIVE mg/dL
Specific Gravity, Urine: 1.019 (ref 1.005–1.030)
pH: 5 (ref 5.0–8.0)

## 2023-11-17 LAB — TYPE AND SCREEN
ABO/RH(D): O POS
Antibody Screen: NEGATIVE

## 2023-11-17 LAB — LIPASE, BLOOD: Lipase: 27 U/L (ref 11–51)

## 2023-11-17 MED ORDER — IOHEXOL 300 MG/ML  SOLN
100.0000 mL | Freq: Once | INTRAMUSCULAR | Status: AC | PRN
  Administered 2023-11-17: 100 mL via INTRAVENOUS

## 2023-11-17 MED ORDER — KETOROLAC TROMETHAMINE 30 MG/ML IJ SOLN
30.0000 mg | Freq: Once | INTRAMUSCULAR | Status: AC
  Administered 2023-11-17: 30 mg via INTRAVENOUS
  Filled 2023-11-17: qty 1

## 2023-11-17 MED ORDER — ONDANSETRON HCL 4 MG/2ML IJ SOLN
4.0000 mg | Freq: Once | INTRAMUSCULAR | Status: AC
  Administered 2023-11-17: 4 mg via INTRAVENOUS
  Filled 2023-11-17: qty 2

## 2023-11-17 MED ORDER — SODIUM CHLORIDE 0.9 % IV SOLN: Freq: Once | INTRAVENOUS | Status: AC

## 2023-11-17 MED ORDER — DICYCLOMINE HCL 10 MG PO CAPS: 10.0000 mg | ORAL_CAPSULE | Freq: Three times a day (TID) | ORAL | 0 refills | Status: AC

## 2023-11-17 MED ORDER — AZITHROMYCIN 250 MG PO TABS: ORAL_TABLET | ORAL | 0 refills | Status: AC

## 2023-11-17 NOTE — ED Triage Notes (Signed)
 Patient brought in by POV with c/o abdominal pain x months however today it got really worst and he had diarrhea and notice blood. Denies any history of GI bleed. Patient has a history of kidney stone.  Wife stated patient was sitting on toilet and had a syncopal episode for 1 minute.

## 2023-11-17 NOTE — ED Provider Notes (Addendum)
 [facility]: [facility] Provider Note    Event Date/Time   First MD Initiated Contact with Patient 11/17/23 [phone]     (approximate)   History   Abdominal Pain  HPI  Kenneth Fowler is a 38 y.o. male with a history of kidney stones who presents with right lower quadrant abdominal pain which he reports is severe and started last night.  Also reports diarrhea, episode of nausea and vomiting.  No fevers reported.  No history of abdominal surgery      Physical Exam   Triage Vital Signs: ED Triage Vitals  Encounter Vitals Group     BP 11/17/23 0526 121/79     Girls Systolic BP Percentile --      Girls Diastolic BP Percentile --      Boys Systolic BP Percentile --      Boys Diastolic BP Percentile --      Pulse Rate 11/17/23 0526 (!) 103     Resp 11/17/23 0526 17     Temp 11/17/23 0526 98.3 F (36.8 C)     Temp Source 11/17/23 0526 Oral     SpO2 11/17/23 0526 98 %     Weight 11/17/23 0533 81.6 kg (180 lb)     Height 11/17/23 0533 1.829 m (6')     Head Circumference --      Peak Flow --      Pain Score 11/17/23 0533 10     Pain Loc --      Pain Education --      Exclude from Growth Chart --     Most recent vital signs: Vitals:   11/17/23 0830 11/17/23 0845  BP:    Pulse: 92 83  Resp: 16 (!) 21  Temp:    SpO2: 100% 100%     General: Awake, CV:  Good peripheral perfusion.  Resp:  Normal effort.  Abd:  No distention.  Mild tenderness right lower quadrant, no CVA tenderness Other:     ED Results / Procedures / Treatments   Labs (all labs ordered are listed, but only abnormal results are displayed) Labs Reviewed  COMPREHENSIVE METABOLIC PANEL WITH GFR - Abnormal; Notable for the following components:      Result Value   Glucose, Bld 120 (*)    Total Protein 8.3 (*)    All other components within normal limits  CBC - Abnormal; Notable for the following components:   WBC 14.0 (*)    All other components within normal limits  URINALYSIS, ROUTINE  W REFLEX MICROSCOPIC - Abnormal; Notable for the following components:   Color, Urine YELLOW (*)    APPearance CLEAR (*)    All other components within normal limits  LIPASE, BLOOD  PROTIME-INR  TYPE AND SCREEN     EKG  ED ECG REPORT I, Lamar Price, the attending physician, personally viewed and interpreted this ECG.  Date: 12/01/2023  Rhythm: normal sinus rhythm QRS Axis: normal Intervals: normal ST/T Wave abnormalities: normal Narrative Interpretation: no evidence of acute ischemia   RADIOLOGY CT abdomen pelvis viewed interpreted by me, no evidence of ureterolithiasis, radiology confirms, no evidence of    PROCEDURES:  Critical Care performed:   Procedures   MEDICATIONS ORDERED IN ED: Medications  ketorolac  (TORADOL ) 30 MG/ML injection 30 mg (30 mg Intravenous Given 11/17/23 0716)  ondansetron  (ZOFRAN ) injection 4 mg (4 mg Intravenous Given 11/17/23 0717)  0.9 %  sodium chloride infusion ( Intravenous New Bag/Given 11/17/23 0737)  iohexol  (OMNIPAQUE ) 300 MG/ML  solution 100 mL (100 mLs Intravenous Contrast Given 11/17/23 9177)     IMPRESSION / MDM / ASSESSMENT AND PLAN / ED COURSE  I reviewed the triage vital signs and the nursing notes. Patient's presentation is most consistent with acute presentation with potential threat to life or bodily function.  Patient presents with right lower quadrant abdominal pain with tenderness in the region.  He has mildly elevated white blood cell count.  Differential includes ureterolithiasis, appendicitis, viral gastroenteritis, colitis  Will treat with IV fluids, Toradol , Zofran , obtain CT abdomen pelvis to rule out appendicitis  CT scan is reassuring, patient feeling better after treatment, suspect likely infectious colitis, appropriate for discharge Bentyl, antibiotics, return precautions discussed, he agrees to this plan      FINAL CLINICAL IMPRESSION(S) / ED DIAGNOSES   Final diagnoses:  Colitis     Rx / DC  Orders   ED Discharge Orders          Ordered    azithromycin (ZITHROMAX Z-PAK) 250 MG tablet        11/17/23 0913    dicyclomine (BENTYL) 10 MG capsule  3 times daily before meals & bedtime        11/17/23 0913             Note:  This document was prepared using Dragon voice recognition software and may include unintentional dictation errors.   Arlander Charleston, MD 11/17/23 1002    Arlander Charleston, MD 12/01/23 2223

## 2023-11-17 NOTE — ED Notes (Signed)
 Patient transported to CT

## 2023-12-31 IMAGING — CR DG ABDOMEN 1V
1 series · 2 of 2 positions shown · non-contrast
Comparison: 05/11/2020 CT

CLINICAL DATA: Follow-up nephrolithiasis

EXAM:
ABDOMEN - 1 VIEW

[Series 1: dg abd 1 view · 0.14mm/px · 2 of 2 slices shown]
[im 1/2]
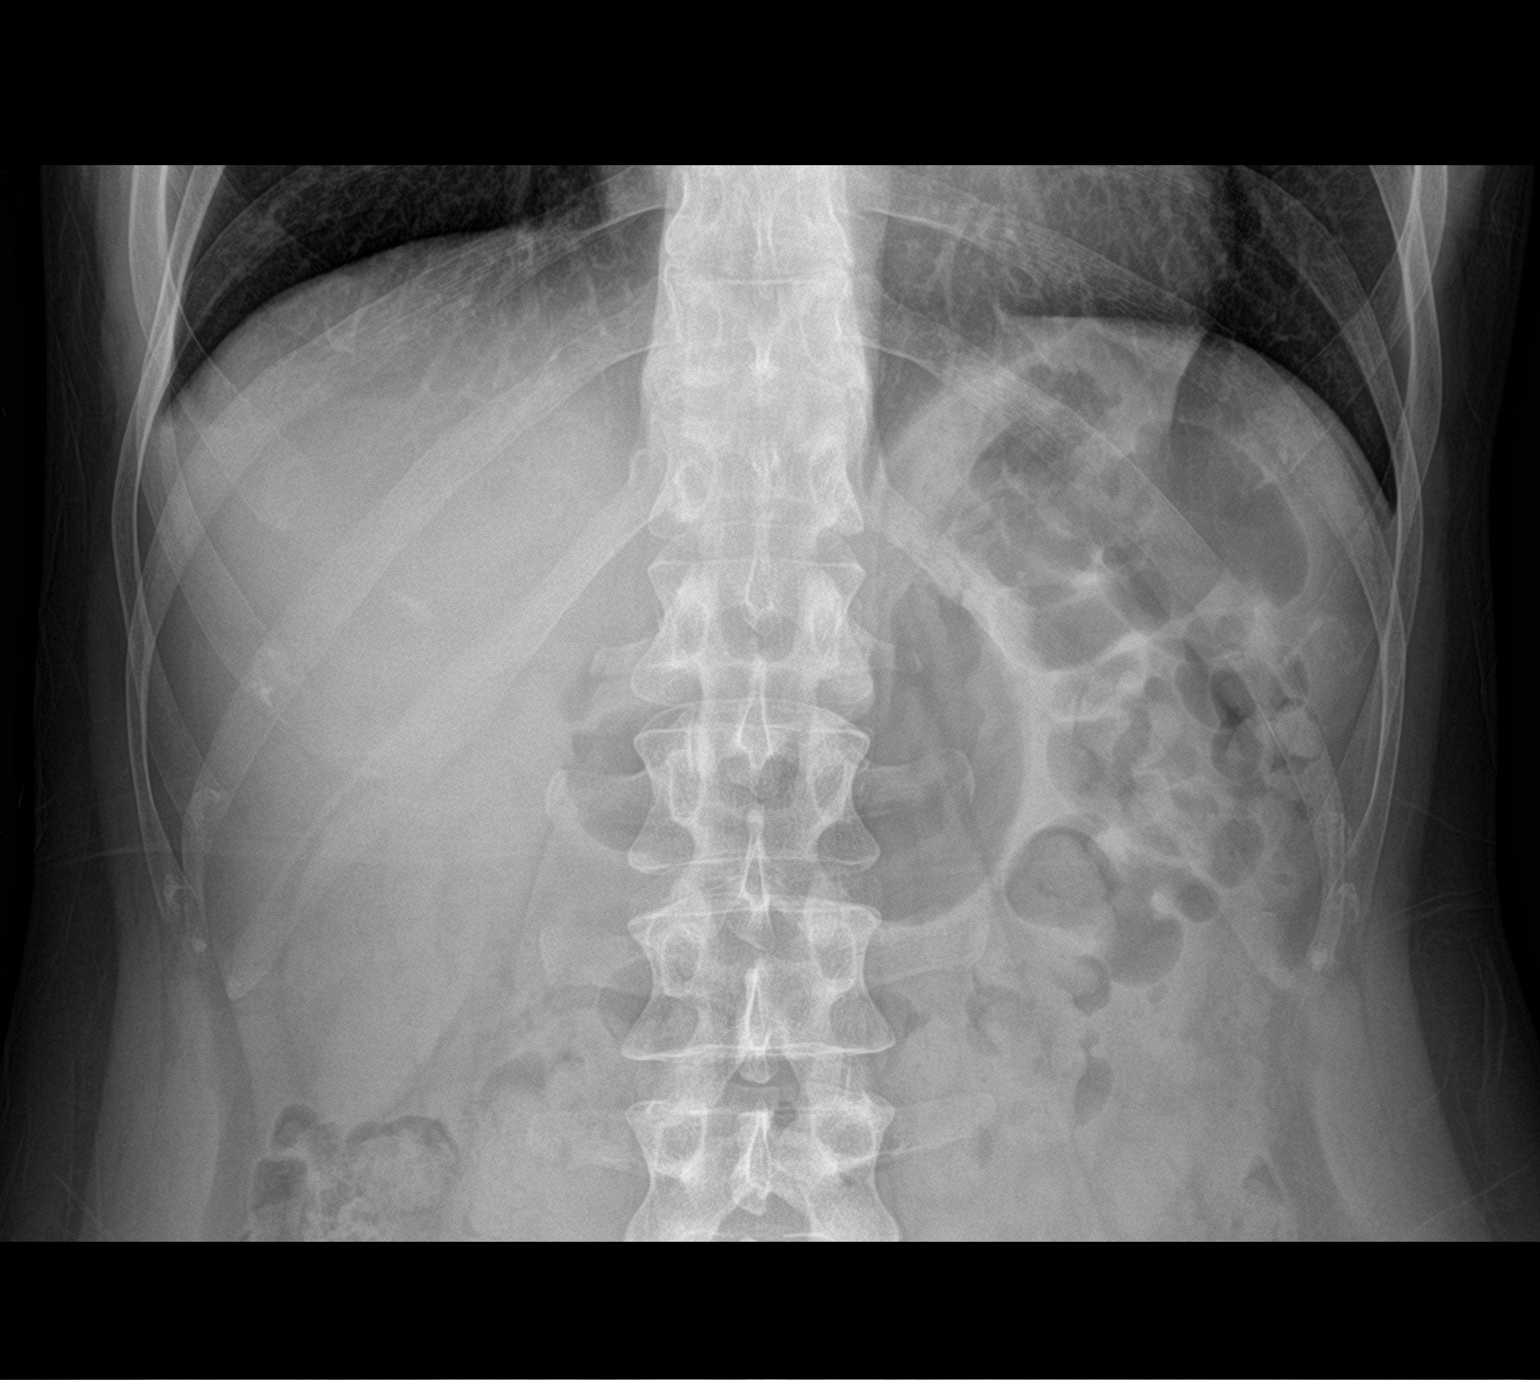
[im 2/2]
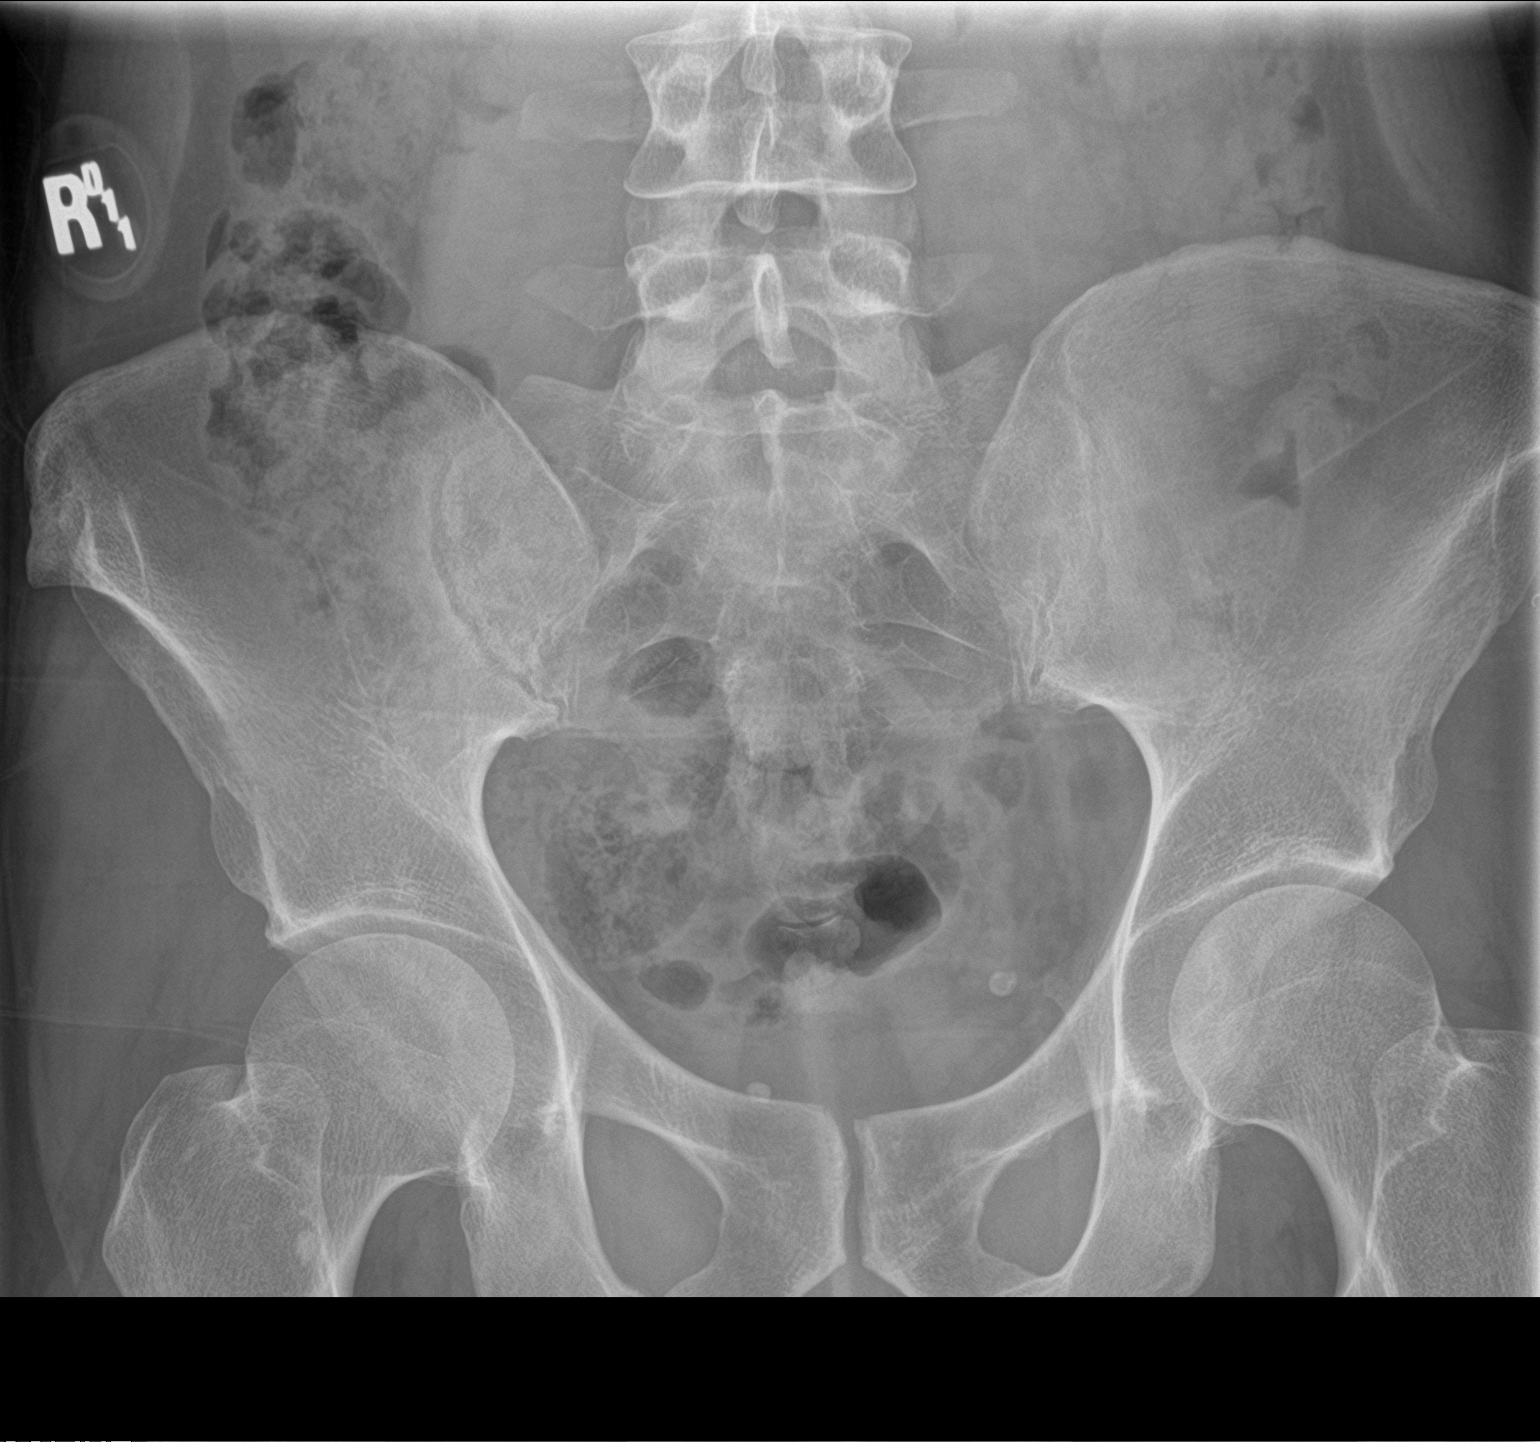

[2 of 2 positions shown; findings below may reference images not displayed]

FINDINGS: Scattered large and small bowel gas is noted. Tiny 2 mm
calcification is noted over the upper pole of the left kidney
similar to that seen on prior CT examination. No other definitive
renal or ureteral stones are seen. No bony abnormality is seen.
IMPRESSION: Tiny 2 mm left upper pole renal stone. No other focal abnormality is
noted.
